# Patient Record
Sex: Female | Born: 1960 | Race: White | Hispanic: No | Marital: Married | Smoking: Never smoker
Health system: Southern US, Community
[De-identification: ages and names within clinical notes are randomized; demographics above are authoritative.]

## PROBLEM LIST (undated history)

## (undated) DIAGNOSIS — H9190 Unspecified hearing loss, unspecified ear: Secondary | ICD-10-CM

## (undated) DIAGNOSIS — K5790 Diverticulosis of intestine, part unspecified, without perforation or abscess without bleeding: Secondary | ICD-10-CM

## (undated) DIAGNOSIS — R112 Nausea with vomiting, unspecified: Secondary | ICD-10-CM

## (undated) DIAGNOSIS — A020 Salmonella enteritis: Secondary | ICD-10-CM

## (undated) DIAGNOSIS — N63 Unspecified lump in unspecified breast: Secondary | ICD-10-CM

## (undated) DIAGNOSIS — E119 Type 2 diabetes mellitus without complications: Secondary | ICD-10-CM

## (undated) DIAGNOSIS — T4145XA Adverse effect of unspecified anesthetic, initial encounter: Secondary | ICD-10-CM

## (undated) DIAGNOSIS — T8859XA Other complications of anesthesia, initial encounter: Secondary | ICD-10-CM

## (undated) DIAGNOSIS — E039 Hypothyroidism, unspecified: Secondary | ICD-10-CM

## (undated) DIAGNOSIS — M1990 Unspecified osteoarthritis, unspecified site: Secondary | ICD-10-CM

## (undated) DIAGNOSIS — B373 Candidiasis of vulva and vagina: Secondary | ICD-10-CM

## (undated) DIAGNOSIS — Z9889 Other specified postprocedural states: Secondary | ICD-10-CM

## (undated) DIAGNOSIS — T7840XA Allergy, unspecified, initial encounter: Secondary | ICD-10-CM

## (undated) DIAGNOSIS — C801 Malignant (primary) neoplasm, unspecified: Secondary | ICD-10-CM

## (undated) HISTORY — DX: Allergy, unspecified, initial encounter: T78.40XA

## (undated) HISTORY — DX: Candidiasis of vulva and vagina: B37.3

## (undated) HISTORY — DX: Unspecified osteoarthritis, unspecified site: M19.90

## (undated) HISTORY — DX: Salmonella enteritis: A02.0

## (undated) HISTORY — DX: Diverticulosis of intestine, part unspecified, without perforation or abscess without bleeding: K57.90

## (undated) HISTORY — DX: Malignant (primary) neoplasm, unspecified: C80.1

## (undated) HISTORY — PX: HYSTEROTOMY: SHX1776

---
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
## 1980-03-21 HISTORY — PX: LAPAROTOMY: SHX154

## 1995-03-22 HISTORY — PX: MENISCUS REPAIR: SHX5179

## 1997-06-26 ENCOUNTER — Ambulatory Visit (HOSPITAL_COMMUNITY): Admission: RE | Admit: 1997-06-26 | Discharge: 1997-06-26 | Payer: Self-pay | Admitting: Obstetrics and Gynecology

## 1997-07-01 ENCOUNTER — Ambulatory Visit (HOSPITAL_COMMUNITY): Admission: RE | Admit: 1997-07-01 | Discharge: 1997-07-01 | Payer: Self-pay | Admitting: Obstetrics and Gynecology

## 1998-01-01 ENCOUNTER — Ambulatory Visit (HOSPITAL_COMMUNITY): Admission: RE | Admit: 1998-01-01 | Discharge: 1998-01-01 | Payer: Self-pay | Admitting: Obstetrics and Gynecology

## 1998-11-09 ENCOUNTER — Other Ambulatory Visit: Admission: RE | Admit: 1998-11-09 | Discharge: 1998-11-09 | Payer: Self-pay | Admitting: Obstetrics and Gynecology

## 1998-11-12 ENCOUNTER — Encounter: Payer: Self-pay | Admitting: Obstetrics and Gynecology

## 1998-11-12 ENCOUNTER — Ambulatory Visit (HOSPITAL_COMMUNITY): Admission: RE | Admit: 1998-11-12 | Discharge: 1998-11-12 | Payer: Self-pay | Admitting: Obstetrics and Gynecology

## 1999-03-02 ENCOUNTER — Other Ambulatory Visit: Admission: RE | Admit: 1999-03-02 | Discharge: 1999-03-02 | Payer: Self-pay | Admitting: Obstetrics and Gynecology

## 1999-06-01 ENCOUNTER — Other Ambulatory Visit: Admission: RE | Admit: 1999-06-01 | Discharge: 1999-06-01 | Payer: Self-pay | Admitting: Obstetrics and Gynecology

## 1999-11-23 ENCOUNTER — Other Ambulatory Visit: Admission: RE | Admit: 1999-11-23 | Discharge: 1999-11-23 | Payer: Self-pay | Admitting: Obstetrics and Gynecology

## 2000-01-26 ENCOUNTER — Ambulatory Visit (HOSPITAL_COMMUNITY): Admission: RE | Admit: 2000-01-26 | Discharge: 2000-01-26 | Payer: Self-pay | Admitting: Obstetrics and Gynecology

## 2000-01-26 ENCOUNTER — Encounter: Payer: Self-pay | Admitting: Obstetrics and Gynecology

## 2000-04-13 ENCOUNTER — Other Ambulatory Visit: Admission: RE | Admit: 2000-04-13 | Discharge: 2000-04-13 | Payer: Self-pay | Admitting: Obstetrics and Gynecology

## 2000-05-29 ENCOUNTER — Other Ambulatory Visit: Admission: RE | Admit: 2000-05-29 | Discharge: 2000-05-29 | Payer: Self-pay | Admitting: Obstetrics and Gynecology

## 2000-05-30 ENCOUNTER — Encounter (INDEPENDENT_AMBULATORY_CARE_PROVIDER_SITE_OTHER): Payer: Self-pay | Admitting: Specialist

## 2000-05-30 ENCOUNTER — Other Ambulatory Visit: Admission: RE | Admit: 2000-05-30 | Discharge: 2000-05-30 | Payer: Self-pay | Admitting: Obstetrics and Gynecology

## 2001-01-29 ENCOUNTER — Encounter: Payer: Self-pay | Admitting: Obstetrics and Gynecology

## 2001-01-29 ENCOUNTER — Ambulatory Visit (HOSPITAL_COMMUNITY): Admission: RE | Admit: 2001-01-29 | Discharge: 2001-01-29 | Payer: Self-pay | Admitting: Obstetrics and Gynecology

## 2001-05-18 ENCOUNTER — Other Ambulatory Visit: Admission: RE | Admit: 2001-05-18 | Discharge: 2001-05-18 | Payer: Self-pay | Admitting: Gynecology

## 2002-01-30 ENCOUNTER — Ambulatory Visit (HOSPITAL_COMMUNITY): Admission: RE | Admit: 2002-01-30 | Discharge: 2002-01-30 | Payer: Self-pay | Admitting: Gynecology

## 2002-01-30 ENCOUNTER — Encounter: Payer: Self-pay | Admitting: Gynecology

## 2002-03-21 HISTORY — PX: AUGMENTATION MAMMAPLASTY: SUR837

## 2002-05-29 ENCOUNTER — Other Ambulatory Visit: Admission: RE | Admit: 2002-05-29 | Discharge: 2002-05-29 | Payer: Self-pay | Admitting: Gynecology

## 2003-04-07 ENCOUNTER — Encounter: Admission: RE | Admit: 2003-04-07 | Discharge: 2003-04-07 | Payer: Self-pay | Admitting: Gynecology

## 2003-06-10 ENCOUNTER — Other Ambulatory Visit: Admission: RE | Admit: 2003-06-10 | Discharge: 2003-06-10 | Payer: Self-pay | Admitting: Gynecology

## 2004-05-13 ENCOUNTER — Encounter: Admission: RE | Admit: 2004-05-13 | Discharge: 2004-05-13 | Payer: Self-pay | Admitting: Gynecology

## 2004-06-07 ENCOUNTER — Other Ambulatory Visit: Admission: RE | Admit: 2004-06-07 | Discharge: 2004-06-07 | Payer: Self-pay | Admitting: Gynecology

## 2005-03-24 ENCOUNTER — Encounter: Admission: RE | Admit: 2005-03-24 | Discharge: 2005-03-24 | Payer: Self-pay | Admitting: Gynecology

## 2005-05-16 ENCOUNTER — Encounter: Admission: RE | Admit: 2005-05-16 | Discharge: 2005-05-16 | Payer: Self-pay | Admitting: Gynecology

## 2005-06-03 ENCOUNTER — Encounter: Admission: RE | Admit: 2005-06-03 | Discharge: 2005-06-03 | Payer: Self-pay | Admitting: Gynecology

## 2005-06-08 ENCOUNTER — Other Ambulatory Visit: Admission: RE | Admit: 2005-06-08 | Discharge: 2005-06-08 | Payer: Self-pay | Admitting: Gynecology

## 2006-06-27 ENCOUNTER — Other Ambulatory Visit: Admission: RE | Admit: 2006-06-27 | Discharge: 2006-06-27 | Payer: Self-pay | Admitting: Gynecology

## 2006-07-18 ENCOUNTER — Encounter: Admission: RE | Admit: 2006-07-18 | Discharge: 2006-07-18 | Payer: Self-pay | Admitting: Gynecology

## 2006-07-20 ENCOUNTER — Encounter: Admission: RE | Admit: 2006-07-20 | Discharge: 2006-07-20 | Payer: Self-pay | Admitting: Gynecology

## 2007-07-19 ENCOUNTER — Encounter: Admission: RE | Admit: 2007-07-19 | Discharge: 2007-07-19 | Payer: Self-pay | Admitting: Gynecology

## 2008-03-25 ENCOUNTER — Other Ambulatory Visit: Admission: RE | Admit: 2008-03-25 | Discharge: 2008-03-25 | Payer: Self-pay | Admitting: Gynecology

## 2008-07-28 ENCOUNTER — Encounter: Admission: RE | Admit: 2008-07-28 | Discharge: 2008-07-28 | Payer: Self-pay | Admitting: Gynecology

## 2009-01-14 ENCOUNTER — Encounter: Admission: RE | Admit: 2009-01-14 | Discharge: 2009-03-05 | Payer: Self-pay | Admitting: Orthopedic Surgery

## 2009-08-04 ENCOUNTER — Encounter: Admission: RE | Admit: 2009-08-04 | Discharge: 2009-08-04 | Payer: Self-pay | Admitting: Gynecology

## 2009-08-06 ENCOUNTER — Encounter: Admission: RE | Admit: 2009-08-06 | Discharge: 2009-08-06 | Payer: Self-pay | Admitting: Gynecology

## 2010-07-08 ENCOUNTER — Other Ambulatory Visit: Payer: Self-pay | Admitting: Gynecology

## 2010-07-08 DIAGNOSIS — Z1231 Encounter for screening mammogram for malignant neoplasm of breast: Secondary | ICD-10-CM

## 2010-08-06 ENCOUNTER — Ambulatory Visit
Admission: RE | Admit: 2010-08-06 | Discharge: 2010-08-06 | Disposition: A | Discharge status: Home / Self Care | Payer: Commercial Managed Care - PPO | Source: Ambulatory Visit | Attending: Gynecology | Admitting: Gynecology

## 2010-08-06 DIAGNOSIS — Z1231 Encounter for screening mammogram for malignant neoplasm of breast: Secondary | ICD-10-CM

## 2012-03-21 HISTORY — PX: COLONOSCOPY: SHX174

## 2012-08-17 ENCOUNTER — Other Ambulatory Visit: Payer: Self-pay

## 2012-08-17 DIAGNOSIS — Z1231 Encounter for screening mammogram for malignant neoplasm of breast: Secondary | ICD-10-CM

## 2012-09-17 DIAGNOSIS — E119 Type 2 diabetes mellitus without complications: Secondary | ICD-10-CM | POA: Insufficient documentation

## 2012-09-19 ENCOUNTER — Ambulatory Visit
Admission: RE | Admit: 2012-09-19 | Discharge: 2012-09-19 | Disposition: A | Discharge status: Home / Self Care | Payer: Commercial Managed Care - PPO | Source: Ambulatory Visit

## 2012-09-19 DIAGNOSIS — Z1231 Encounter for screening mammogram for malignant neoplasm of breast: Secondary | ICD-10-CM

## 2013-08-01 ENCOUNTER — Other Ambulatory Visit: Payer: Self-pay

## 2013-08-01 DIAGNOSIS — Z1231 Encounter for screening mammogram for malignant neoplasm of breast: Secondary | ICD-10-CM

## 2013-08-01 DIAGNOSIS — Z803 Family history of malignant neoplasm of breast: Secondary | ICD-10-CM

## 2013-09-30 ENCOUNTER — Ambulatory Visit
Admission: RE | Admit: 2013-09-30 | Discharge: 2013-09-30 | Disposition: A | Discharge status: Home / Self Care | Payer: PRIVATE HEALTH INSURANCE | Source: Ambulatory Visit

## 2013-09-30 ENCOUNTER — Encounter (INDEPENDENT_AMBULATORY_CARE_PROVIDER_SITE_OTHER): Payer: Self-pay

## 2013-09-30 DIAGNOSIS — Z1231 Encounter for screening mammogram for malignant neoplasm of breast: Secondary | ICD-10-CM

## 2013-09-30 DIAGNOSIS — Z803 Family history of malignant neoplasm of breast: Secondary | ICD-10-CM

## 2014-01-27 ENCOUNTER — Emergency Department (INDEPENDENT_AMBULATORY_CARE_PROVIDER_SITE_OTHER)
Admission: EM | Admit: 2014-01-27 | Discharge: 2014-01-27 | Disposition: A | Discharge status: Home / Self Care | Payer: PRIVATE HEALTH INSURANCE | Source: Home / Self Care | Attending: Family Medicine | Admitting: Family Medicine

## 2014-01-27 ENCOUNTER — Encounter (HOSPITAL_COMMUNITY): Payer: Self-pay | Admitting: Emergency Medicine

## 2014-01-27 DIAGNOSIS — R0982 Postnasal drip: Secondary | ICD-10-CM

## 2014-01-27 DIAGNOSIS — H109 Unspecified conjunctivitis: Secondary | ICD-10-CM

## 2014-01-27 DIAGNOSIS — J069 Acute upper respiratory infection, unspecified: Secondary | ICD-10-CM

## 2014-01-27 HISTORY — DX: Type 2 diabetes mellitus without complications: E11.9

## 2014-01-27 LAB — POCT RAPID STREP A: Streptococcus, Group A Screen (Direct): NEGATIVE

## 2014-01-27 MED ORDER — POLYMYXIN B-TRIMETHOPRIM 10000-0.1 UNIT/ML-% OP SOLN: 1.0000 [drp] | OPHTHALMIC | Status: DC

## 2014-01-27 NOTE — ED Notes (Signed)
Pt states she started with a cough and sore throat 8 days ago.  It has developed into laryngitis and now eye drainage.  She saw [facility] at [facility] on Friday and was told it was viral, but she is traveling on Wednesday and wanted to make sure she did not have anything else.

## 2014-01-27 NOTE — Discharge Instructions (Signed)
Conjunctivitis Zaditor eye drops for eye inflammation Conjunctivitis is commonly called "pink eye." Conjunctivitis can be caused by bacterial or viral infection, allergies, or injuries. There is usually redness of the lining of the eye, itching, discomfort, and sometimes discharge. There may be deposits of matter along the eyelids. A viral infection usually causes a watery discharge, while a bacterial infection causes a yellowish, thick discharge. Pink eye is very contagious and spreads by direct contact. You may be given antibiotic eyedrops as part of your treatment. Before using your eye medicine, remove all drainage from the eye by washing gently with warm water and cotton balls. Continue to use the medication until you have awakened 2 mornings in a row without discharge from the eye. Do not rub your eye. This increases the irritation and helps spread infection. Use separate towels from other household members. Wash your hands with soap and water before and after touching your eyes. Use cold compresses to reduce pain and sunglasses to relieve irritation from light. Do not wear contact lenses or wear eye makeup until the infection is gone. SEEK MEDICAL CARE IF:   Your symptoms are not better after 3 days of treatment.  You have increased pain or trouble seeing.  The outer eyelids become very red or swollen. Document Released: 04/14/2004 Document Revised: 05/30/2011 Document Reviewed: 03/07/2005 [facility] Patient Information 2015 [place], [place]. This information is not intended to replace advice given to you by your health care provider. Make sure you discuss any questions you have with your health care provider.  Upper Respiratory Infection, Adult An upper respiratory infection (URI) is also sometimes known as the common cold. The upper respiratory tract includes the nose, sinuses, throat, trachea, and bronchi. Bronchi are the airways leading to the lungs. Most people improve within 1 week, but  symptoms can last up to 2 weeks. A residual cough may last even longer.  CAUSES Many different viruses can infect the tissues lining the upper respiratory tract. The tissues become irritated and inflamed and often become very moist. Mucus production is also common. A cold is contagious. You can easily spread the virus to others by oral contact. This includes kissing, sharing a glass, coughing, or sneezing. Touching your mouth or nose and then touching a surface, which is then touched by another person, can also spread the virus. SYMPTOMS  Symptoms typically develop 1 to 3 days after you come in contact with a cold virus. Symptoms vary from person to person. They may include:  Runny nose.  Sneezing.  Nasal congestion.  Sinus irritation.  Sore throat.  Loss of voice (laryngitis).  Cough.  Fatigue.  Muscle aches.  Loss of appetite.  Headache.  Low-grade fever. DIAGNOSIS  You might diagnose your own cold based on familiar symptoms, since most people get a cold 2 to 3 times a year. Your caregiver can confirm this based on your exam. Most importantly, your caregiver can check that your symptoms are not due to another disease such as strep throat, sinusitis, pneumonia, asthma, or epiglottitis. Blood tests, throat tests, and X-rays are not necessary to diagnose a common cold, but they may sometimes be helpful in excluding other more serious diseases. Your caregiver will decide if any further tests are required. RISKS AND COMPLICATIONS  You may be at risk for a more severe case of the common cold if you smoke cigarettes, have chronic heart disease (such as heart failure) or lung disease (such as asthma), or if you have a weakened immune system. The very  young and very old are also at risk for more serious infections. Bacterial sinusitis, middle ear infections, and bacterial pneumonia can complicate the common cold. The common cold can worsen asthma and chronic obstructive pulmonary disease  (COPD). Sometimes, these complications can require emergency medical care and may be life-threatening. PREVENTION  The best way to protect against getting a cold is to practice good hygiene. Avoid oral or hand contact with people with cold symptoms. Wash your hands often if contact occurs. There is no clear evidence that vitamin C, vitamin E, echinacea, or exercise reduces the chance of developing a cold. However, it is always recommended to get plenty of rest and practice good nutrition. TREATMENT  Treatment is directed at relieving symptoms. There is no cure. Antibiotics are not effective, because the infection is caused by a virus, not by bacteria. Treatment may include:  Increased fluid intake. Sports drinks offer valuable electrolytes, sugars, and fluids.  Breathing heated mist or steam (vaporizer or shower).  Eating chicken soup or other clear broths, and maintaining good nutrition.  Getting plenty of rest.  Using gargles or lozenges for comfort.  Controlling fevers with ibuprofen or acetaminophen as directed by your caregiver.  Increasing usage of your inhaler if you have asthma. Zinc gel and zinc lozenges, taken in the first 24 hours of the common cold, can shorten the duration and lessen the severity of symptoms. Pain medicines may help with fever, muscle aches, and throat pain. A variety of non-prescription medicines are available to treat congestion and runny nose. Your caregiver can make recommendations and may suggest nasal or lung inhalers for other symptoms.  HOME CARE INSTRUCTIONS   Only take over-the-counter or prescription medicines for pain, discomfort, or fever as directed by your caregiver.  Use a warm mist humidifier or inhale steam from a shower to increase air moisture. This may keep secretions moist and make it easier to breathe.  Drink enough water and fluids to keep your urine clear or pale yellow.  Rest as needed.  Return to work when your temperature has  returned to normal or as your caregiver advises. You may need to stay home longer to avoid infecting others. You can also use a face mask and careful hand washing to prevent spread of the virus. SEEK MEDICAL CARE IF:   After the first few days, you feel you are getting worse rather than better.  You need your caregiver's advice about medicines to control symptoms.  You develop chills, worsening shortness of breath, or brown or red sputum. These may be signs of pneumonia.  You develop yellow or brown nasal discharge or pain in the face, especially when you bend forward. These may be signs of sinusitis.  You develop a fever, swollen neck glands, pain with swallowing, or white areas in the back of your throat. These may be signs of strep throat. SEEK IMMEDIATE MEDICAL CARE IF:   You have a fever.  You develop severe or persistent headache, ear pain, sinus pain, or chest pain.  You develop wheezing, a prolonged cough, cough up blood, or have a change in your usual mucus (if you have chronic lung disease).  You develop sore muscles or a stiff neck. Document Released: 08/31/2000 Document Revised: 05/30/2011 Document Reviewed: 06/12/2013 [facility] Patient Information 2015 [place], [place]. This information is not intended to replace advice given to you by your health care provider. Make sure you discuss any questions you have with your health care provider.

## 2014-01-27 NOTE — ED Provider Notes (Signed)
CSN: [id number]     Arrival date & time 01/27/14  0813 History   First MD Initiated Contact with Patient 01/27/14 [phone]     Chief Complaint  Patient presents with  . Eye Drainage    bilateral  . Sore Throat  . Laryngitis  . Cough   (Consider location/radiation/quality/duration/timing/severity/associated sxs/prior Treatment) HPI Comments: 53 year old female presents with URI-type symptoms.  Initially sore throat followed by laryngitis approximately 8 days ago. She  had a low-grade fever stated as possibly 99. Continues to have mild PND, throat feels a little bit better but more recently having red eyes with straining.  She was seen by. [facility] with similar symptoms 3 days ago and was told she had a virus.   Past Medical History  Diagnosis Date  . Diabetes mellitus without complication    Past Surgical History  Procedure Laterality Date  . Laparotomy     Family History  Problem Relation Age of Onset  . Arthritis Mother   . Hypertension Father   . Diabetes Father   . Cancer Sister    History  Substance Use Topics  . Smoking status: Never Smoker   . Smokeless tobacco: Never Used  . Alcohol Use: No   OB History    No data available     Review of Systems  Constitutional: Positive for fever and activity change. Negative for chills, appetite change and fatigue.  HENT: Positive for congestion, postnasal drip, rhinorrhea, sore throat and voice change. Negative for facial swelling.   Eyes: Negative.   Respiratory: Negative.   Cardiovascular: Negative.   Musculoskeletal: Negative for neck pain and neck stiffness.  Skin: Negative for pallor and rash.  Neurological: Negative.     Allergies  Bee venom; Codeine; and Penicillins  Home Medications   Prior to Admission medications   Medication Sig Start Date End Date Taking? Authorizing Provider  estradiol (VIVELLE-DOT) 0.0375 MG/24HR Place 1 patch onto the skin 2 (two) times a week.   Yes Historical Provider, MD   insulin NPH Human (HUMULIN N,NOVOLIN N) 100 UNIT/ML injection Inject 6 Units into the skin at bedtime.   Yes Historical Provider, MD  metFORMIN (GLUCOPHAGE) 500 MG tablet Take 200 mg by mouth daily before supper.   Yes Historical Provider, MD  progesterone (PROMETRIUM) 100 MG capsule Take 100 mg by mouth daily.   Yes Historical Provider, MD  trimethoprim-polymyxin b (POLYTRIM) ophthalmic solution Place 1 drop into both eyes every 4 (four) hours. 01/27/14   Janne Napoleon, NP   BP 132/80 mmHg  Pulse 75  Temp(Src) 98.3 F (36.8 C) (Oral)  Resp 16  SpO2 99%  LMP 08/03/2010 Physical Exam  Constitutional: She is oriented to person, place, and time. She appears well-developed and well-nourished. No distress.  HENT:  bialt TMs' nl OP with minor erythema, cobblestoning and scant clear PND  Eyes: EOM are normal.  Conjunctivae erythematous, sclera with minor redness. No drainage.  Neck: Normal range of motion. Neck supple.  Cardiovascular: Normal rate, regular rhythm and normal heart sounds.   Pulmonary/Chest: Effort normal and breath sounds normal. No respiratory distress. She has no wheezes. She has no rales.  Abdominal: Soft. There is no tenderness.  Musculoskeletal: Normal range of motion. She exhibits no edema.  Lymphadenopathy:    She has no cervical adenopathy.  Neurological: She is alert and oriented to person, place, and time.  Skin: Skin is warm and dry. No rash noted.  Psychiatric: She has a normal mood and affect.  Nursing note  and vitals reviewed.   ED Course  Procedures (including critical care time) Labs Review Labs Reviewed  POCT RAPID STREP A ([place])   Results for orders placed or performed during the hospital encounter of 01/27/14  POCT rapid strep A [facility])  Result Value Ref Range   Streptococcus, Group A Screen (Direct) NEGATIVE NEGATIVE    Imaging Review No results found.   MDM   1. URI (upper respiratory infection)   2. PND (post-nasal  drip)   3. Bilateral conjunctivitis   Polytrim opth zaditor optha Continue your OTC meds for symptoms Rest , fluids     Janne Napoleon, NP 01/27/14 [phone]

## 2014-01-29 LAB — CULTURE, GROUP A STREP

## 2014-06-23 ENCOUNTER — Encounter: Payer: Self-pay | Admitting: *Deleted

## 2014-08-25 ENCOUNTER — Other Ambulatory Visit: Payer: Self-pay

## 2014-08-25 DIAGNOSIS — Z1231 Encounter for screening mammogram for malignant neoplasm of breast: Secondary | ICD-10-CM

## 2014-10-06 ENCOUNTER — Ambulatory Visit
Admission: RE | Admit: 2014-10-06 | Discharge: 2014-10-06 | Disposition: A | Discharge status: Home / Self Care | Payer: BLUE CROSS/BLUE SHIELD | Source: Ambulatory Visit

## 2014-10-06 DIAGNOSIS — Z1231 Encounter for screening mammogram for malignant neoplasm of breast: Secondary | ICD-10-CM

## 2015-06-20 DIAGNOSIS — A020 Salmonella enteritis: Secondary | ICD-10-CM

## 2015-06-20 HISTORY — DX: Salmonella enteritis: A02.0

## 2015-07-20 DIAGNOSIS — B3731 Acute candidiasis of vulva and vagina: Secondary | ICD-10-CM

## 2015-07-20 DIAGNOSIS — B373 Candidiasis of vulva and vagina: Secondary | ICD-10-CM

## 2015-07-20 HISTORY — DX: Acute candidiasis of vulva and vagina: B37.31

## 2015-07-20 HISTORY — DX: Candidiasis of vulva and vagina: B37.3

## 2015-07-23 ENCOUNTER — Other Ambulatory Visit: Payer: BLUE CROSS/BLUE SHIELD

## 2015-07-23 ENCOUNTER — Ambulatory Visit (INDEPENDENT_AMBULATORY_CARE_PROVIDER_SITE_OTHER): Payer: BLUE CROSS/BLUE SHIELD | Admitting: Internal Medicine

## 2015-07-23 ENCOUNTER — Encounter: Payer: Self-pay | Admitting: Internal Medicine

## 2015-07-23 VITALS — BP 132/70 | HR 80 | Ht 62.21 in | Wt 122.5 lb

## 2015-07-23 DIAGNOSIS — R197 Diarrhea, unspecified: Secondary | ICD-10-CM

## 2015-07-23 DIAGNOSIS — A09 Infectious gastroenteritis and colitis, unspecified: Secondary | ICD-10-CM | POA: Diagnosis not present

## 2015-07-23 MED ORDER — TINIDAZOLE 500 MG PO TABS: 2.0000 g | ORAL_TABLET | Freq: Every day | ORAL | Status: DC

## 2015-07-23 NOTE — Progress Notes (Signed)
Subjective:    Patient ID: Elizabeth [name], female    DOB: 10-02-1960, 55 y.o.   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 OK upon return April 14but then began to have bloating, mild cramps and loose watery stools to soft. Has had up to several stools in 24 hrs and has recently had urge incontonence. Pepto-bismol and loperamide seemed to have helped. No bleeding. ? Fever x 1 All other GI ROS negative Negative screening colonoscopy 2014 [place]  Allergies  Allergen Reactions  . Bee Venom   . Codeine   . Penicillins    Outpatient Prescriptions Prior to Visit  Medication Sig Dispense Refill  . insulin NPH Human (HUMULIN N,NOVOLIN N) 100 UNIT/ML injection Inject 13 Units into the skin at bedtime.     . metFORMIN (GLUCOPHAGE) 500 MG tablet Take 1,000 mg by mouth 2 (two) times daily.     . progesterone (PROMETRIUM) 100 MG capsule Take 150 mg by mouth daily.     Marland Kitchen estradiol (VIVELLE-DOT) 0.0375 MG/24HR Place 1 patch onto the skin 2 (two) times a week.    . trimethoprim-polymyxin b (POLYTRIM) ophthalmic solution Place 1 drop into both eyes every 4 (four) hours. 10 mL 0   No facility-administered medications prior to visit.   Past Medical History  Diagnosis Date  . Diabetes mellitus without complication ([place])   . Diverticulosis    Past Surgical History  Procedure Laterality Date  . Laparotomy  1982  . Meniscus repair Left 1997  . Tonsillectomy  1966   Social History   Social History  . Marital Status: Married    Spouse Name: N/A  . Number of Children: 3  . Years of Education: N/A   Occupational History  . RN    Social History Main Topics  . Smoking status: Never Smoker   . Smokeless tobacco: Never Used  . Alcohol Use: 0.0 oz/week    0 Standard drinks or equivalent per week     Comment: couple glasses of wine a week  . Drug Use: No  . Sexual Activity:  Not Asked   Other Topics Concern  . 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 Arthritis Mother   . Hypertension Father   . Diabetes Father   . Breast cancer Sister   . Heart disease Father     Review of Systems As above all other ROS negative    Objective:   Physical Exam @BP  132/70 mmHg  Pulse 80  Ht 5' 2.21" (1.58 m)  Wt 122 lb 8 oz (55.566 kg)  BMI 22.26 kg/m2  LMP 08/03/2010@  General:  Well-developed, well-nourished and in no acute distress Eyes:  anicteric. Lungs: Clear to auscultation bilaterally. Heart:  S1S2, no rubs, murmurs, gallops. Abdomen:  soft, non-tender, no hepatosplenomegaly, hernia, or mass and BS+.  Extremities:   no edema, cyanosis or clubbing  Psych:  appropriate mood and  Affect.    Assessment & Plan:   Encounter Diagnosis  Name Primary?  . Diarrhea of presumed infectious origin Yes   Giardia, amebiasis, high on list. Will do stool Giardia/Cryptosporidum and GI stool pathogen panel Tx w/Tindazole 2000 mg qd x 3d Prn loperamide  Off work in 2 d - potentially infectious - need diarrhea to resolve  F/u will be arranged as needed  [id number] E, MD

## 2015-07-23 NOTE — Patient Instructions (Addendum)
   Your physician has requested that you go to the basement for the following lab work before leaving today: Stool studies  We have sent the following medications to your pharmacy for you to pick up at your convenience: Tindamax  Stay out of work until diarrhea gone, note given.   I appreciate the opportunity to care for you.

## 2015-07-24 ENCOUNTER — Telehealth: Payer: Self-pay | Admitting: Gastroenterology

## 2015-07-24 LAB — GASTROINTESTINAL PATHOGEN PANEL PCR
C. difficile Tox A/B, PCR: NOT DETECTED
CRYPTOSPORIDIUM, PCR: NOT DETECTED
Campylobacter, PCR: NOT DETECTED
E COLI (STEC) STX1/STX2, PCR: NOT DETECTED
E coli (ETEC) LT/ST PCR: NOT DETECTED
E coli 0157, PCR: NOT DETECTED
GIARDIA LAMBLIA, PCR: NOT DETECTED
NOROVIRUS, PCR: NOT DETECTED
ROTAVIRUS, PCR: NOT DETECTED
SHIGELLA, PCR: NOT DETECTED
Salmonella, PCR: DETECTED — CR

## 2015-07-24 MED ORDER — CIPROFLOXACIN HCL 500 MG PO TABS: 500.0000 mg | ORAL_TABLET | Freq: Two times a day (BID) | ORAL | Status: DC

## 2015-07-25 NOTE — Telephone Encounter (Signed)
Informed results of stool pathogen positive for Salmonella, given she is symptomatic, sent Rx for Cipro 500mg  BID x 10 days

## 2015-07-26 NOTE — Progress Notes (Signed)
Quick Note:  Started on cipro 5/6 ______

## 2015-07-31 LAB — GIARDIA/CRYPTOSPORIDIUM (EIA): Source:: NEGATIVE

## 2015-08-05 ENCOUNTER — Encounter: Payer: Self-pay | Admitting: Internal Medicine

## 2015-08-05 ENCOUNTER — Telehealth: Payer: Self-pay | Admitting: Internal Medicine

## 2015-08-05 MED ORDER — FLUCONAZOLE 150 MG PO TABS: 150.0000 mg | ORAL_TABLET | Freq: Once | ORAL | Status: DC

## 2015-08-05 NOTE — Telephone Encounter (Signed)
Vaginal itching dc consistent with yeast infection after Abx  Will Rx fluconazole 150 mg qd  Having 2 loose stools/day still but overall better - advised will take some time to return to NL - is using probiotic  F/u prn

## 2015-09-23 ENCOUNTER — Other Ambulatory Visit: Payer: Self-pay

## 2015-09-23 ENCOUNTER — Other Ambulatory Visit: Payer: Self-pay | Admitting: Family Medicine

## 2015-09-23 ENCOUNTER — Encounter: Payer: Self-pay | Admitting: Family Medicine

## 2015-09-23 ENCOUNTER — Ambulatory Visit (INDEPENDENT_AMBULATORY_CARE_PROVIDER_SITE_OTHER): Payer: BLUE CROSS/BLUE SHIELD | Admitting: Family Medicine

## 2015-09-23 VITALS — BP 118/70 | HR 75 | Ht 62.21 in | Wt 126.0 lb

## 2015-09-23 DIAGNOSIS — S76301A Unspecified injury of muscle, fascia and tendon of the posterior muscle group at thigh level, right thigh, initial encounter: Secondary | ICD-10-CM

## 2015-09-23 DIAGNOSIS — S76311A Strain of muscle, fascia and tendon of the posterior muscle group at thigh level, right thigh, initial encounter: Secondary | ICD-10-CM | POA: Diagnosis not present

## 2015-09-23 DIAGNOSIS — Z1231 Encounter for screening mammogram for malignant neoplasm of breast: Secondary | ICD-10-CM

## 2015-09-23 DIAGNOSIS — S76319A Strain of muscle, fascia and tendon of the posterior muscle group at thigh level, unspecified thigh, initial encounter: Secondary | ICD-10-CM | POA: Insufficient documentation

## 2015-09-23 DIAGNOSIS — S76302A Unspecified injury of muscle, fascia and tendon of the posterior muscle group at thigh level, left thigh, initial encounter: Secondary | ICD-10-CM

## 2015-09-23 MED ORDER — VITAMIN D (ERGOCALCIFEROL) 1.25 MG (50000 UNIT) PO CAPS: 50000.0000 [IU] | ORAL_CAPSULE | ORAL | Status: DC

## 2015-09-23 MED ORDER — MELOXICAM 15 MG PO TABS: 15.0000 mg | ORAL_TABLET | Freq: Every day | ORAL | Status: DC

## 2015-09-23 MED ORDER — NITROGLYCERIN 0.2 MG/HR TD PT24: MEDICATED_PATCH | TRANSDERMAL | Status: DC

## 2015-09-23 NOTE — Progress Notes (Signed)
Pre visit review using our clinic review tool, if applicable. No additional management support is needed unless otherwise documented below in the visit note. 

## 2015-09-23 NOTE — Progress Notes (Signed)
[name] [address], [place] [address] Phone: [phone] Subjective:     CC: right leg pain  [id number] Elizabeth [name] is a 55 y.o. female coming in with complaint of right leg pain. Patient states that she has had discomfort quite some time but recently got significant worse. Patient was trying to chase down a 37-year-old and felt a sharp pain on her right buttocks. This is usually where she has pain. Has noticed radiation going down the leg. Patient states he can even go to her heel. Patient states that it is severe enough that is stopping her from certain activities. Patient states that she has not been able to go to run or jog for probably a decade. Patient was told that she likely had a partial tear of her hamstring but never went to formal physical therapy or did anything else about it. Patient states that at the moment even with walking she is having pain. Denies any associated back pain. Denies any significant weakness. Has noticed it feels better when she walks down the stairs backwards. Has not tried any home modalities at this time.     Past Medical History  Diagnosis Date  . Diabetes mellitus without complication ([place])   . Diverticulosis   . 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 Vaginal candidiasis 07/2015    after antibiotics   Past Surgical History  Procedure Laterality Date  . Laparotomy  1982  . Meniscus repair Left 1997  . Tonsillectomy  1966  . Colonoscopy  2014   Social History   Social History  . Marital Status: Married    Spouse Name: N/A  . Number of Children: 3  . Years of Education: N/A   Occupational History  . RN    Social History Main Topics  . Smoking status: Never Smoker   . Smokeless tobacco: Never Used  . Alcohol Use: 0.0 oz/week    0 Standard drinks or equivalent per week     Comment: couple glasses of wine a week  . Drug Use: No  . Sexual Activity: Not Asked   Other Topics Concern  . 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 Bee Venom   . Codeine   . Penicillins    Family History  Problem Relation Age of Onset  . Arthritis Mother   . Hypertension Father   . Diabetes Father   . Breast cancer Sister   . Heart disease Father     Past medical history, social, surgical and family history all reviewed in electronic medical record.  No pertanent information unless stated regarding to the chief complaint.   Review of Systems: No headache, visual changes, nausea, vomiting, diarrhea, constipation, dizziness, abdominal pain, skin rash, fevers, chills, night sweats, weight loss, swollen lymph nodes, body aches, joint swelling, muscle aches, chest pain, shortness of breath, mood changes.   Objective Blood pressure 118/70, pulse 75, height 5' 2.21" (1.58 m), weight 126 lb (57.153 kg), last menstrual period 08/03/2010, SpO2 98 %.  General: No apparent distress alert and oriented x3 mood and affect normal, dressed appropriately.  HEENT: Pupils equal, extraocular movements intact  Respiratory: Patient's speak in full sentences and does not appear short of breath  Cardiovascular: No lower extremity edema, non tender, no erythema  Skin: Warm dry intact with  no signs of infection or rash on extremities or on axial skeleton.  Abdomen: Soft nontender  Neuro: Cranial nerves II through XII are intact, neurovascularly intact in all extremities with 2+ DTRs and 2+ pulses.  Lymph: No lymphadenopathy of posterior or anterior cervical chain or axillae bilaterally.  Gait normal with good balance and coordination.  MSK:  Non tender with full range of motion and good stability and symmetric strength and tone of shoulders, elbows, wrist, hip, and ankles bilaterally.   right leg exam shows the patient does have severe tenderness to palpation in the ischial tuberosity. No defect noted in the  muscle. Patient does have tenderness to compression of the hamstring itself mid belly. Once again no defect. No significant weakness but patient does have pain with internal rotation and flexion of the leg. Neurovascularly intact distally with 5 out of 5 strength.  Limited musculoskeletal ultrasound was performed and interpreted by Hulan Saas, M  Limited ultrasound the patient's right hamstring shows that patient does have what appears to be a chronic avulsion fracture of the ischial area. No significant increase in Doppler flow or hypoechoic changes. Patient does have some very mild intersubstance tearing at the origin of the hamstring. Proximally 7 cm distally from this area seems to be an acute muscle injury less than 0.2 cm in diameter. Increasing Doppler flow and hypoechoic changes. Patient also has significant surrounding scar tissue formation. 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  Procedure note [id number]; 15 minutes spent for Therapeutic exercises as stated in above notes.  This included exercises focusing on stretching, strengthening, with significant focus on eccentric aspects.  Flexion and extension exercises focusing on eccentric to the hamstring as well as strengthening. Proper technique shown and discussed handout in great detail with ATC.  All questions were discussed and answered.      Impression and Recommendations:     This case required medical decision making of moderate complexity.      Note: This dictation was prepared with Dragon dictation along with smaller phrase technology. Any transcriptional errors that result from this process are unintentional.

## 2015-09-23 NOTE — Patient Instructions (Signed)
Good to see you.  Ice 20 minutes 2 times daily. Usually after activity and before bed. Meloxicam daily for 10 days Once weekly vitamin D for next 12 weeks.  Nitroglycerin Protocol   Apply 1/4 nitroglycerin patch to affected area daily.  Change position of patch within the affected area every 24 hours.  You may experience a headache during the first 1-2 weeks of using the patch, these should subside.  If you experience headaches after beginning nitroglycerin patch treatment, you may take your preferred over the counter pain reliever.  Another side effect of the nitroglycerin patch is skin irritation or rash related to patch adhesive.  Please notify our office if you develop more severe headaches or rash, and stop the patch.  Tendon healing with nitroglycerin patch may require 12 to 24 weeks depending on the extent of injury.  Men should not use if taking Viagra, Cialis, or Levitra.   Do not use if you have migraines or rosacea.  Thigh compression daily look at [facility] or [employer] sports.  Exercises 3 times a week. Start on Monday  OK to do pool or elliptical only  See me again in 3 weeks

## 2015-09-23 NOTE — Assessment & Plan Note (Signed)
Patient does have tear of the hamstring. We discussed icing regimen and home exercises. We discussed which activities to do an which was potentially avoid. Patient will try compression sleeve, patient work with athletic trainer to learn home exercises in greater detail. We discussed vitamin D supplementation secondary to the chronic avulsion. We discussed which activities to avoid. Oral anti-inflammatories are given as well. Due to the duration of this pain over the course of time patient is going to start a nitroglycerin and warned of potential side effect. Patient and will come back and see me again in 3-4 weeks for further evaluation and treatment.

## 2015-09-24 ENCOUNTER — Telehealth: Payer: Self-pay | Admitting: Family Medicine

## 2015-09-24 NOTE — Telephone Encounter (Signed)
Pt request to speak to the assistant concern about office visit yesterday. Please call her back

## 2015-09-24 NOTE — Telephone Encounter (Signed)
I would not.  I would try the conservative therapy for the next 3 weeks and see how we do.  I would expect some improvement.  We can discuss the possibility of scheduling at follow up but I am hoping the vitamin D will help

## 2015-09-24 NOTE — Telephone Encounter (Signed)
Spoke with patient and attempted to answer questions. Asked if Dr. Tamala Julian thought she should have the PRP injection sooner rather than wait 3weeks until she came back for her follow-up.

## 2015-09-25 NOTE — Telephone Encounter (Signed)
Responded to pt through mychart 

## 2015-09-28 ENCOUNTER — Other Ambulatory Visit: Payer: Self-pay | Admitting: Family Medicine

## 2015-09-28 ENCOUNTER — Encounter: Payer: Self-pay | Admitting: Family Medicine

## 2015-10-06 ENCOUNTER — Ambulatory Visit: Payer: BLUE CROSS/BLUE SHIELD | Admitting: Family Medicine

## 2015-10-09 ENCOUNTER — Ambulatory Visit
Admission: RE | Admit: 2015-10-09 | Discharge: 2015-10-09 | Disposition: A | Discharge status: Home / Self Care | Payer: BLUE CROSS/BLUE SHIELD | Source: Ambulatory Visit | Attending: Family Medicine | Admitting: Family Medicine

## 2015-10-09 DIAGNOSIS — Z1231 Encounter for screening mammogram for malignant neoplasm of breast: Secondary | ICD-10-CM

## 2015-10-12 ENCOUNTER — Ambulatory Visit (INDEPENDENT_AMBULATORY_CARE_PROVIDER_SITE_OTHER): Payer: Self-pay | Admitting: Family Medicine

## 2015-10-12 ENCOUNTER — Other Ambulatory Visit: Payer: Self-pay

## 2015-10-12 ENCOUNTER — Encounter: Payer: Self-pay | Admitting: Family Medicine

## 2015-10-12 VITALS — BP 132/80 | HR 86 | Wt 122.0 lb

## 2015-10-12 DIAGNOSIS — S76311D Strain of muscle, fascia and tendon of the posterior muscle group at thigh level, right thigh, subsequent encounter: Secondary | ICD-10-CM

## 2015-10-12 NOTE — Assessment & Plan Note (Signed)
Patient given injection today. We'll avoid any type of icing her home exercises for the next 48 hours. We discussed with compression, we will hold on the nitroglycerin for now. Follow-up again in 2 weeks. At that time we will start. Patient increase activity slowly.

## 2015-10-12 NOTE — Patient Instructions (Signed)
Good to see you  Injected the hamstring today  No exercising really for next 48 hours.  No ice for 48 hours either.  After that start stretches again 3 times a week.  After 1 week can start to increase to biking and consider elliptical but at very low resistance (50%) and slow pace.  Week to then increase 10% a week See me again in 2-3 weeks and we will get you back

## 2015-10-12 NOTE — Progress Notes (Signed)
[name] [address] [place], [place] [address] Phone: [phone] Subjective:     CC: right leg pain f/u  [id number]  Elizabeth [name] is a 55 y.o. female coming in with complaint of right leg pain. Patient states that she has had discomfort quite some time but recently got significant worse. Patient was trying to chase down a 35-year-old and felt a sharp pain on her right buttocks. Patient had more of a partial hamstring tear.  Patient was to start addressing patches, home exercises and compression. Patient states  Patient is elected do PRP injection.    Past Medical History:  Diagnosis Date  . Diabetes mellitus without complication ([place])   . Diverticulosis   . 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 Vaginal candidiasis 07/2015   after antibiotics   Past Surgical History:  Procedure Laterality Date  . COLONOSCOPY  2014  . LAPAROTOMY  1982  . MENISCUS REPAIR Left 1997  . TONSILLECTOMY  1966   Social History   Social History  . Marital status: Married    Spouse name: N/A  . Number of children: 3  . Years of education: N/A   Occupational History  . RN    Social History Main Topics  . Smoking status: Never Smoker  . Smokeless tobacco: Never Used  . Alcohol use 0.0 oz/week     Comment: couple glasses of wine a week  . Drug use: No  . Sexual activity: Not on file   Other Topics Concern  . 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 Bee Venom   . Codeine   . Penicillins    Family History  Problem Relation Age of Onset  . Arthritis Mother   . Hypertension Father   . Diabetes Father   . Breast cancer Sister   . Heart disease Father     Past medical history, social, surgical and family history all reviewed in electronic medical record.  No pertanent information unless stated regarding to the chief complaint.    Review of Systems: No headache, visual changes, nausea, vomiting, diarrhea, constipation, dizziness, abdominal pain, skin rash, fevers, chills, night sweats, weight loss, swollen lymph nodes, body aches, joint swelling, muscle aches, chest pain, shortness of breath, mood changes.   Objective  Last menstrual period 08/03/2010.  General: No apparent distress alert and oriented x3 mood and affect normal, dressed appropriately.  HEENT: Pupils equal, extraocular movements intact  Respiratory: Patient's speak in full sentences and does not appear short of breath  Cardiovascular: No lower extremity edema, non tender, no erythema  Skin: Warm dry intact with no signs of infection or rash on extremities or on axial skeleton.  Abdomen: Soft nontender  Neuro: Cranial nerves II through XII are intact, neurovascularly intact in all extremities with 2+ DTRs and 2+ pulses.  Lymph: No lymphadenopathy of posterior or anterior cervical chain or axillae bilaterally.  Gait normal with good balance and coordination.  MSK:  Non tender with full range of motion and good stability and symmetric strength and tone of shoulders, elbows, wrist, hip, and ankles bilaterally.   right leg exam shows the patient does have severe tenderness to palpation in the ischial tuberosity. No defect noted in the muscle. Patient does have tenderness to compression of the hamstring itself mid belly. Once again  no defect. No significant weakness but patient does have pain with internal rotation and flexion of the leg. Neurovascularly intact distally with 5 out of 5 strength.  Procedure: Real-time Ultrasound Guided Injection of  Device: GE Logiq E  Ultrasound guided injection is preferred based studies that show increased duration, increased effect, greater accuracy, decreased procedural pain, increased response rate, and decreased cost with ultrasound guided versus blind injection.  Verbal informed consent obtained.  Time-out conducted.   Noted no overlying erythema, induration, or other signs of local infection.  Skin prepped in a sterile fashion.  Local anesthesia: Topical Ethyl chloride.  With sterile technique and under real time ultrasound guidance:   Completed without difficulty  Pain immediately resolved suggesting accurate placement of the medication.  Advised to call if fevers/chills, erythema, induration, drainage, or persistent bleeding.  Images permanently stored and available for review in the ultrasound unit.  Impression: Technically successful ultrasound guided injection.      Impression and Recommendations:     This case required medical decision making of moderate complexity.      Note: This dictation was prepared with Dragon dictation along with smaller phrase technology. Any transcriptional errors that result from this process are unintentional.

## 2015-10-19 ENCOUNTER — Telehealth: Payer: Self-pay | Admitting: Emergency Medicine

## 2015-10-22 NOTE — Progress Notes (Signed)
[name] [address] [place], [place] [address] Phone: [phone] Subjective:     CC: right leg pain f/u  [id number]  Elizabeth [name] is a 55 y.o. female coming in with complaint of right leg pain. Patient states that she has had discomfort quite some time but recently got significant worse. Patient was trying to chase down a 20-year-old and felt a sharp pain on her right buttocks. Patient had more of a partial hamstring tear.  Patient was to start addressing patches, home exercises and compression.  Patient is elected to do PRP injection. Patient is now 2 weeks out from PRP.  Patient states  She is improving. Things like sitting is not as painful as it was previously. Still when she tries to increase her walking she has no discomfort. Not as severe as what it was previously though. Think she is making progress. Rates the severity pain now is 3 out of 10. Patient has been doing the exercises fairly regularly. Has not increased her activity though    Past Medical History:  Diagnosis Date  . Diabetes mellitus without complication ([place])   . Diverticulosis   . 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 Vaginal candidiasis 07/2015   after antibiotics   Past Surgical History:  Procedure Laterality Date  . COLONOSCOPY  2014  . LAPAROTOMY  1982  . MENISCUS REPAIR Left 1997  . TONSILLECTOMY  1966   Social History   Social History  . Marital status: Married    Spouse name: N/A  . Number of children: 3  . Years of education: N/A   Occupational History  . RN    Social History Main Topics  . Smoking status: Never Smoker  . Smokeless tobacco: Never Used  . Alcohol use 0.0 oz/week     Comment: couple glasses of wine a week  . Drug use: No  . Sexual activity: Not Asked   Other Topics Concern  . 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  Bee Venom   . Codeine   . Penicillins    Family History  Problem Relation Age of Onset  . Arthritis Mother   . Hypertension Father   . Diabetes Father   . Breast cancer Sister   . Heart disease Father     Past medical history, social, surgical and family history all reviewed in electronic medical record.  No pertanent information unless stated regarding to the chief complaint.   Review of Systems: No headache, visual changes, nausea, vomiting, diarrhea, constipation, dizziness, abdominal pain, skin rash, fevers, chills, night sweats, weight loss, swollen lymph nodes, body aches, joint swelling, muscle aches, chest pain, shortness of breath, mood changes.   Objective  Blood pressure 128/72, pulse 75, height 5\' 2"  (1.575 m), weight 122 lb (55.3 kg), last menstrual period 08/03/2010, SpO2 98 %.  General: No apparent distress alert and oriented x3 mood and affect normal, dressed appropriately.  HEENT: Pupils equal, extraocular movements intact  Respiratory: Patient's speak in full sentences and does not appear short of breath  Cardiovascular: No lower extremity edema, non tender, no erythema  Skin: Warm dry intact with no signs of infection or rash on extremities or on axial skeleton.  Abdomen: Soft nontender  Neuro: Cranial nerves II through XII are intact, neurovascularly intact in all extremities with  2+ DTRs and 2+ pulses.  Lymph: No lymphadenopathy of posterior or anterior cervical chain or axillae bilaterally.  Gait normal with good balance and coordination.  MSK:  Non tender with full range of motion and good stability and symmetric strength and tone of shoulders, elbows, wrist, hip, and ankles bilaterally.   right leg exam  No defect noted in the muscle. Nontender over the hamstring itself. Patient has full strength of the hamstring. Still some mild discomfort over the ischial tuberosity.  Limited muscular skeletal ultrasound was performed and interpreted by Lyndal Pulley    Limited ultrasound shows the patient's partial tear is significantly improved. Significant decrease in hypoechoic changes. Will patient did have possible avulsion fracture seems to be doing very well. Callus formation noted. Impression: Healing avulsion fracture. Impression tear    Impression and Recommendations:     This case required medical decision making of moderate complexity.      Note: This dictation was prepared with Dragon dictation along with smaller phrase technology. Any transcriptional errors that result from this process are unintentional.

## 2015-10-23 ENCOUNTER — Other Ambulatory Visit: Payer: Self-pay

## 2015-10-23 ENCOUNTER — Encounter: Payer: Self-pay | Admitting: Family Medicine

## 2015-10-23 ENCOUNTER — Ambulatory Visit (INDEPENDENT_AMBULATORY_CARE_PROVIDER_SITE_OTHER): Payer: BLUE CROSS/BLUE SHIELD | Admitting: Family Medicine

## 2015-10-23 VITALS — BP 128/72 | HR 75 | Ht 62.0 in | Wt 122.0 lb

## 2015-10-23 DIAGNOSIS — S76311S Strain of muscle, fascia and tendon of the posterior muscle group at thigh level, right thigh, sequela: Secondary | ICD-10-CM | POA: Diagnosis not present

## 2015-10-23 NOTE — Patient Instructions (Signed)
God to see you  Ice is your friend Compression should be good only with exercise now.  Keep doing everything else and PT will call you Have a great time Consider gabapentin  See me again in 3-4 weeks.

## 2015-10-23 NOTE — Assessment & Plan Note (Addendum)
Patient hamstring tear seems to be doing relatively well. We discussed icing regimen and home exercises. We discussed which activities to do an which was potentially avoid. Patient will start on formal physical therapy that I think will be beneficial at this point. Patient will follow-up with me otherwise in 4 weeks. At that time if doing well we will completely release her. Worsening symptoms we'll consider possible lumbar radiculopathy and starting gabapentin. Spent  25 minutes with patient face-to-face and had greater than 50% of counseling including as described above in assessment and plan.

## 2015-10-29 NOTE — Telephone Encounter (Signed)
a 

## 2015-10-30 ENCOUNTER — Ambulatory Visit: Payer: BLUE CROSS/BLUE SHIELD | Admitting: Family Medicine

## 2016-08-29 ENCOUNTER — Other Ambulatory Visit: Payer: Self-pay | Admitting: Obstetrics and Gynecology

## 2016-08-29 DIAGNOSIS — Z1231 Encounter for screening mammogram for malignant neoplasm of breast: Secondary | ICD-10-CM

## 2016-10-25 ENCOUNTER — Other Ambulatory Visit: Payer: Self-pay | Admitting: Obstetrics and Gynecology

## 2016-10-25 ENCOUNTER — Ambulatory Visit
Admission: RE | Admit: 2016-10-25 | Discharge: 2016-10-25 | Disposition: A | Discharge status: Home / Self Care | Payer: BLUE CROSS/BLUE SHIELD | Source: Ambulatory Visit | Attending: Obstetrics and Gynecology | Admitting: Obstetrics and Gynecology

## 2016-10-25 DIAGNOSIS — Z1231 Encounter for screening mammogram for malignant neoplasm of breast: Secondary | ICD-10-CM

## 2016-10-25 DIAGNOSIS — N63 Unspecified lump in unspecified breast: Secondary | ICD-10-CM

## 2016-10-25 HISTORY — DX: Unspecified lump in unspecified breast: N63.0

## 2017-12-21 ENCOUNTER — Encounter (HOSPITAL_COMMUNITY): Payer: Self-pay | Admitting: *Deleted

## 2017-12-21 ENCOUNTER — Other Ambulatory Visit: Payer: Self-pay

## 2017-12-21 NOTE — Anesthesia Preprocedure Evaluation (Addendum)
Anesthesia Evaluation  Patient identified by MRN, date of birth, ID band Patient awake    Reviewed: Allergy & Precautions, NPO status , Patient's Chart, lab work & pertinent test results  History of Anesthesia Complications (+) PONV  Airway Mallampati: I  TM Distance: >3 FB Neck ROM: Full    Dental  (+) Dental Advisory Given   Pulmonary neg pulmonary ROS,    breath sounds clear to auscultation       Cardiovascular negative cardio ROS   Rhythm:Regular Rate:Normal     Neuro/Psych negative neurological ROS     GI/Hepatic negative GI ROS, Neg liver ROS,   Endo/Other  diabetes (glu 111), Oral Hypoglycemic Agents, Insulin DependentHypothyroidism   Renal/GU negative Renal ROS     Musculoskeletal   Abdominal   Peds  Hematology negative hematology ROS (+)   Anesthesia Other Findings   Reproductive/Obstetrics                            Anesthesia Physical Anesthesia Plan  ASA: III  Anesthesia Plan: General   Post-op Pain Management:    Induction: Intravenous  PONV Risk Score and Plan: 3 and Ondansetron, Dexamethasone and Scopolamine patch - Pre-op  Airway Management Planned: LMA  Additional Equipment:   Intra-op Plan:   Post-operative Plan:   Informed Consent: I have reviewed the patients History and Physical, chart, labs and discussed the procedure including the risks, benefits and alternatives for the proposed anesthesia with the patient or authorized representative who has indicated his/her understanding and acceptance.   Dental advisory given  Plan Discussed with: CRNA and Surgeon  Anesthesia Plan Comments: (Plan routine monitors, GA- LMA OK)        Anesthesia Quick Evaluation

## 2017-12-21 NOTE — Progress Notes (Signed)
I instructed patient to check CBG after awaking and every 2 hours until arrival  to the hospital.  I Instructed patient if CBG is less than 70 to take 4 Glucose Tablets or 1 tube of Glucose Gel.  Recheck CBG in 15 minutes then call pre- op desk at [phone] for further instructions. If scheduled to receive Insulin, do not take Insulin.

## 2017-12-22 ENCOUNTER — Ambulatory Visit (HOSPITAL_COMMUNITY): Payer: BLUE CROSS/BLUE SHIELD | Admitting: Certified Registered Nurse Anesthetist

## 2017-12-22 ENCOUNTER — Encounter (HOSPITAL_COMMUNITY): Payer: Self-pay | Admitting: *Deleted

## 2017-12-22 ENCOUNTER — Encounter (HOSPITAL_COMMUNITY)
Admission: RE | Disposition: A | Discharge status: Home / Self Care | Payer: Self-pay | Source: Ambulatory Visit | Attending: Oculoplastics Ophthalmology

## 2017-12-22 ENCOUNTER — Ambulatory Visit (HOSPITAL_COMMUNITY)
Admission: RE | Admit: 2017-12-22 | Discharge: 2017-12-22 | Disposition: A | Discharge status: Home / Self Care | Payer: BLUE CROSS/BLUE SHIELD | Source: Ambulatory Visit | Attending: Oculoplastics Ophthalmology | Admitting: Oculoplastics Ophthalmology

## 2017-12-22 DIAGNOSIS — Z7984 Long term (current) use of oral hypoglycemic drugs: Secondary | ICD-10-CM | POA: Insufficient documentation

## 2017-12-22 DIAGNOSIS — E119 Type 2 diabetes mellitus without complications: Secondary | ICD-10-CM | POA: Diagnosis not present

## 2017-12-22 DIAGNOSIS — C441192 Basal cell carcinoma of skin of left lower eyelid, including canthus: Secondary | ICD-10-CM | POA: Diagnosis not present

## 2017-12-22 DIAGNOSIS — D22112 Melanocytic nevi of right lower eyelid, including canthus: Secondary | ICD-10-CM | POA: Diagnosis not present

## 2017-12-22 HISTORY — DX: Adverse effect of unspecified anesthetic, initial encounter: T41.45XA

## 2017-12-22 HISTORY — PX: LID LESION EXCISION: SHX5204

## 2017-12-22 HISTORY — DX: Hypothyroidism, unspecified: E03.9

## 2017-12-22 HISTORY — DX: Other complications of anesthesia, initial encounter: T88.59XA

## 2017-12-22 HISTORY — DX: Other specified postprocedural states: R11.2

## 2017-12-22 HISTORY — DX: Unspecified hearing loss, unspecified ear: H91.90

## 2017-12-22 HISTORY — DX: Nausea with vomiting, unspecified: Z98.890

## 2017-12-22 LAB — CBC
HCT: 42.1 % (ref 36.0–46.0)
HEMOGLOBIN: 13.8 g/dL (ref 12.0–15.0)
MCH: 30.3 pg (ref 26.0–34.0)
MCHC: 32.8 g/dL (ref 30.0–36.0)
MCV: 92.5 fL (ref 78.0–100.0)
Platelets: 214 10*3/uL (ref 150–400)
RBC: 4.55 MIL/uL (ref 3.87–5.11)
RDW: 11.9 % (ref 11.5–15.5)
WBC: 4.5 10*3/uL (ref 4.0–10.5)

## 2017-12-22 LAB — BASIC METABOLIC PANEL
Anion gap: 14 (ref 5–15)
BUN: 20 mg/dL (ref 6–20)
CHLORIDE: 100 mmol/L (ref 98–111)
CO2: 24 mmol/L (ref 22–32)
Calcium: 9.4 mg/dL (ref 8.9–10.3)
Creatinine, Ser: 0.82 mg/dL (ref 0.44–1.00)
GFR calc Af Amer: >60 mL/min (ref >60)
GFR calc non Af Amer: >60 mL/min (ref >60)
GLUCOSE: 118 mg/dL — AB (ref 70–99)
Potassium: 3.6 mmol/L (ref 3.5–5.1)
SODIUM: 138 mmol/L (ref 135–145)

## 2017-12-22 LAB — PROTIME-INR
INR: 0.97
Prothrombin Time: 12.8 seconds (ref 11.4–15.2)

## 2017-12-22 LAB — GLUCOSE, CAPILLARY
Glucose-Capillary: 111 mg/dL — ABNORMAL HIGH (ref 70–99)
Glucose-Capillary: 85 mg/dL (ref 70–99)

## 2017-12-22 SURGERY — EXCISION, LESION, EYELID
Anesthesia: General | Laterality: Left

## 2017-12-22 MED ORDER — MEPERIDINE HCL 50 MG/ML IJ SOLN: 6.2500 mg | INTRAMUSCULAR | Status: DC | PRN

## 2017-12-22 MED ORDER — TOBRAMYCIN-DEXAMETHASONE 0.3-0.1 % OP OINT
TOPICAL_OINTMENT | OPHTHALMIC | Status: AC
  Filled 2017-12-22: qty 3.5

## 2017-12-22 MED ORDER — PROMETHAZINE HCL 6.25 MG/5ML PO SYRP: 25.0000 mg | ORAL_SOLUTION | Freq: Every day | ORAL | 1 refills | Status: DC

## 2017-12-22 MED ORDER — OXYCODONE HCL 5 MG PO TABS
5.0000 mg | ORAL_TABLET | Freq: Once | ORAL | Status: AC
  Administered 2017-12-22: 5 mg via ORAL

## 2017-12-22 MED ORDER — LIDOCAINE-EPINEPHRINE 1 %-1:100000 IJ SOLN
INTRAMUSCULAR | Status: AC
  Filled 2017-12-22: qty 1

## 2017-12-22 MED ORDER — LACTATED RINGERS IV SOLN
INTRAVENOUS | Status: DC | PRN
  Administered 2017-12-22: 07:00:00 via INTRAVENOUS

## 2017-12-22 MED ORDER — KETOROLAC TROMETHAMINE 30 MG/ML IJ SOLN
INTRAMUSCULAR | Status: AC
  Filled 2017-12-22: qty 1

## 2017-12-22 MED ORDER — PROPOFOL 10 MG/ML IV BOLUS
INTRAVENOUS | Status: DC | PRN
  Administered 2017-12-22: 200 mg via INTRAVENOUS

## 2017-12-22 MED ORDER — SCOPOLAMINE 1 MG/3DAYS TD PT72
MEDICATED_PATCH | TRANSDERMAL | Status: DC | PRN
  Administered 2017-12-22: 1 via TRANSDERMAL

## 2017-12-22 MED ORDER — ACETAMINOPHEN 10 MG/ML IV SOLN
INTRAVENOUS | Status: AC
  Administered 2017-12-22: 1000 mg
  Filled 2017-12-22: qty 100

## 2017-12-22 MED ORDER — OXYCODONE HCL 5 MG PO TABS
ORAL_TABLET | ORAL | Status: AC
  Administered 2017-12-22: 5 mg via ORAL
  Filled 2017-12-22: qty 1

## 2017-12-22 MED ORDER — FENTANYL CITRATE (PF) 250 MCG/5ML IJ SOLN
INTRAMUSCULAR | Status: DC | PRN
  Administered 2017-12-22 (×2): 25 ug via INTRAVENOUS

## 2017-12-22 MED ORDER — FENTANYL CITRATE (PF) 100 MCG/2ML IJ SOLN: 25.0000 ug | INTRAMUSCULAR | Status: DC | PRN

## 2017-12-22 MED ORDER — BUPIVACAINE HCL (PF) 0.5 % IJ SOLN
INTRAMUSCULAR | Status: AC
  Filled 2017-12-22: qty 10

## 2017-12-22 MED ORDER — PREDNISONE 10 MG PO TABS: 20.0000 mg | ORAL_TABLET | Freq: Every day | ORAL | 0 refills | Status: DC

## 2017-12-22 MED ORDER — ONDANSETRON HCL 4 MG/2ML IJ SOLN
INTRAMUSCULAR | Status: DC | PRN
  Administered 2017-12-22: 4 mg via INTRAVENOUS

## 2017-12-22 MED ORDER — KETOROLAC TROMETHAMINE 30 MG/ML IJ SOLN
30.0000 mg | Freq: Once | INTRAMUSCULAR | Status: AC
  Administered 2017-12-22: 30 mg via INTRAVENOUS

## 2017-12-22 MED ORDER — PROPOFOL 10 MG/ML IV BOLUS
INTRAVENOUS | Status: AC
  Filled 2017-12-22: qty 40

## 2017-12-22 MED ORDER — LIDOCAINE HCL (CARDIAC) PF 100 MG/5ML IV SOSY
PREFILLED_SYRINGE | INTRAVENOUS | Status: DC | PRN
  Administered 2017-12-22: 40 mg via INTRAVENOUS

## 2017-12-22 MED ORDER — PROMETHAZINE HCL 25 MG/ML IJ SOLN: 6.2500 mg | INTRAMUSCULAR | Status: DC | PRN

## 2017-12-22 MED ORDER — MIDAZOLAM HCL 5 MG/5ML IJ SOLN
INTRAMUSCULAR | Status: DC | PRN
  Administered 2017-12-22: 2 mg via INTRAVENOUS

## 2017-12-22 MED ORDER — PHENYLEPHRINE 40 MCG/ML (10ML) SYRINGE FOR IV PUSH (FOR BLOOD PRESSURE SUPPORT)
PREFILLED_SYRINGE | INTRAVENOUS | Status: DC | PRN
  Administered 2017-12-22: 40 ug via INTRAVENOUS
  Administered 2017-12-22: 80 ug via INTRAVENOUS
  Administered 2017-12-22: 40 ug via INTRAVENOUS

## 2017-12-22 MED ORDER — DEXAMETHASONE SODIUM PHOSPHATE 4 MG/ML IJ SOLN
INTRAMUSCULAR | Status: DC | PRN
  Administered 2017-12-22: 4 mg via INTRAVENOUS

## 2017-12-22 MED ORDER — ACETAMINOPHEN 500 MG PO TABS: 1000.0000 mg | ORAL_TABLET | Freq: Four times a day (QID) | ORAL | 0 refills | Status: AC

## 2017-12-22 MED ORDER — FENTANYL CITRATE (PF) 250 MCG/5ML IJ SOLN
INTRAMUSCULAR | Status: AC
  Filled 2017-12-22: qty 5

## 2017-12-22 MED ORDER — MIDAZOLAM HCL 2 MG/2ML IJ SOLN
INTRAMUSCULAR | Status: AC
  Filled 2017-12-22: qty 2

## 2017-12-22 MED ORDER — TETRACAINE HCL 0.5 % OP SOLN
OPHTHALMIC | Status: AC
  Filled 2017-12-22: qty 4

## 2017-12-22 MED ORDER — MIDAZOLAM HCL 2 MG/2ML IJ SOLN: 0.5000 mg | Freq: Once | INTRAMUSCULAR | Status: DC | PRN

## 2017-12-22 MED ORDER — LIDOCAINE-EPINEPHRINE 1 %-1:100000 IJ SOLN
INTRAMUSCULAR | Status: DC | PRN
  Administered 2017-12-22: 10 mL via INTRAMUSCULAR

## 2017-12-22 MED ORDER — TOBRAMYCIN-DEXAMETHASONE 0.3-0.1 % OP OINT
TOPICAL_OINTMENT | OPHTHALMIC | Status: DC | PRN
  Administered 2017-12-22: 1 via OPHTHALMIC

## 2017-12-22 SURGICAL SUPPLY — 43 items
APL SRG 3 HI ABS STRL LF PLS (MISCELLANEOUS) ×2
APL SWBSTK 6 STRL LF DISP (MISCELLANEOUS) ×4
APPLICATOR COTTON TIP 6 STRL (MISCELLANEOUS) ×4 IMPLANT
APPLICATOR COTTON TIP 6IN STRL (MISCELLANEOUS) ×6
APPLICATOR DR MATTHEWS STRL (MISCELLANEOUS) ×3 IMPLANT
BANDAGE EYE OVAL (MISCELLANEOUS) IMPLANT
BLADE SURG 15 STRL LF DISP TIS (BLADE) ×2 IMPLANT
BLADE SURG 15 STRL SS (BLADE) ×3
BNDG EYE OVAL (GAUZE/BANDAGES/DRESSINGS) ×4 IMPLANT
CLSR STERI-STRIP ANTIMIC 1/2X4 (GAUZE/BANDAGES/DRESSINGS) ×3 IMPLANT
CORD BIPOLAR FORCEPS 12FT (ELECTRODE) ×3 IMPLANT
COVER LIGHT HANDLE STERIS (MISCELLANEOUS) ×3 IMPLANT
COVER SURGICAL LIGHT HANDLE (MISCELLANEOUS) ×3 IMPLANT
COVER WAND RF STERILE (DRAPES) ×3 IMPLANT
DRAPE ORTHO SPLIT 87X125 STRL (DRAPES) ×3 IMPLANT
DRESSING TELFA 8X10 (GAUZE/BANDAGES/DRESSINGS) IMPLANT
ELECT NDL BLADE 2-5/6 (NEEDLE) ×1 IMPLANT
ELECT NEEDLE BLADE 2-5/6 (NEEDLE) ×3 IMPLANT
ELECT REM PT RETURN 9FT ADLT (ELECTROSURGICAL) ×3
ELECTRODE REM PT RTRN 9FT ADLT (ELECTROSURGICAL) ×2 IMPLANT
FORCEPS BIPOLAR SPETZLER 8 1.0 (NEUROSURGERY SUPPLIES) IMPLANT
FRAME EYE SHIELD (PROTECTIVE WEAR) IMPLANT
GLOVE BIO SURGEON STRL SZ7.5 (GLOVE) ×6 IMPLANT
GOWN STRL REUS W/ TWL LRG LVL3 (GOWN DISPOSABLE) ×4 IMPLANT
GOWN STRL REUS W/TWL LRG LVL3 (GOWN DISPOSABLE) ×6
KIT BASIN OR (CUSTOM PROCEDURE TRAY) ×3 IMPLANT
NDL PRECISIONGLIDE 27X1.5 (NEEDLE) IMPLANT
NEEDLE PRECISIONGLIDE 27X1.5 (NEEDLE) IMPLANT
NS IRRIG 1000ML POUR BTL (IV SOLUTION) ×3 IMPLANT
PACK CATARACT CUSTOM (CUSTOM PROCEDURE TRAY) ×3 IMPLANT
PAD ARMBOARD 7.5X6 YLW CONV (MISCELLANEOUS) ×6 IMPLANT
PENCIL BUTTON HOLSTER BLD 10FT (ELECTRODE) ×3 IMPLANT
SUT CHROMIC 5 0 P 3 (SUTURE) IMPLANT
SUT ETHILON 6 0 P 1 (SUTURE) IMPLANT
SUT ETHILON 7 0 P 6 18 (SUTURE) IMPLANT
SUT MERSILENE 5 0 RD 1 DA (SUTURE) ×2 IMPLANT
SUT PLAIN 5 0 P 3 18 (SUTURE) IMPLANT
SUT PLAIN 6 0 TG1408 (SUTURE) IMPLANT
SUT SILK 4 0 P 3 (SUTURE) ×2 IMPLANT
SUT VICRYL 6-0 S14 (SUTURE) IMPLANT
TAPE STRIPS DRAPE STRL (GAUZE/BANDAGES/DRESSINGS) ×2 IMPLANT
TOWEL NATURAL 6PK STERILE (DISPOSABLE) ×3 IMPLANT
WATER STERILE IRR 1000ML POUR (IV SOLUTION) ×3 IMPLANT

## 2017-12-22 NOTE — Anesthesia Procedure Notes (Signed)
Procedure Name: LMA Insertion Date/Time: 12/22/2017 7:47 AM Performed by: Glynda Jaeger, CRNA Pre-anesthesia Checklist: Patient identified, Emergency Drugs available, Suction available and Patient being monitored Patient Re-evaluated:Patient Re-evaluated prior to induction Oxygen Delivery Method: Circle System Utilized Preoxygenation: Pre-oxygenation with 100% oxygen Induction Type: IV induction Ventilation: Mask ventilation without difficulty LMA: LMA inserted LMA Size: 4.0 Number of attempts: 1 Airway Equipment and Method: Bite block Placement Confirmation: positive ETCO2 Tube secured with: Tape Dental Injury: Teeth and Oropharynx as per pre-operative assessment

## 2017-12-22 NOTE — Transfer of Care (Signed)
Immediate Anesthesia Transfer of Care Note  Patient: Elizabeth [name]  Procedure(s) Performed: LID LESION EXCISION with reconstruction of lower   left and right eye lids (Left )  Patient Location: PACU  Anesthesia Type:General  Level of Consciousness: awake, alert , oriented, patient cooperative and responds to stimulation  Airway & Oxygen Therapy: Patient Spontanous Breathing and Patient connected to nasal cannula oxygen  Post-op Assessment: Report given to RN, Post -op Vital signs reviewed and stable and Patient moving all extremities X 4  Post vital signs: Reviewed and stable  Last Vitals:  Vitals Value Taken Time  BP 150/83 12/22/2017  9:07 AM  Temp    Pulse 84 12/22/2017  9:09 AM  Resp 13 12/22/2017  9:09 AM  SpO2 98 % 12/22/2017  9:09 AM  Vitals shown include unvalidated device data.  Last Pain: There were no vitals filed for this visit.       Complications: No apparent anesthesia complications

## 2017-12-22 NOTE — Anesthesia Postprocedure Evaluation (Signed)
Anesthesia Post Note  Patient: Elizabeth [name]  Procedure(s) Performed: LID LESION EXCISION with reconstruction of lower   left and right eye lids (Left )     Patient location during evaluation: PACU Anesthesia Type: General Level of consciousness: awake and alert, oriented and patient cooperative Pain management: pain level controlled Vital Signs Assessment: post-procedure vital signs reviewed and stable Respiratory status: spontaneous breathing, nonlabored ventilation, respiratory function stable and patient connected to nasal cannula oxygen Cardiovascular status: blood pressure returned to baseline and stable Postop Assessment: no apparent nausea or vomiting Anesthetic complications: no    Last Vitals:  Vitals:   12/22/17 0940 12/22/17 0955  BP: (!) 149/81 (!) 149/77  Pulse: 72 75  Resp: 11 14  Temp:    SpO2: 99% 99%    Last Pain:  Vitals:   12/22/17 1030  PainSc: 5                  [name],E. Tremont Gavitt

## 2017-12-22 NOTE — Op Note (Signed)
Procedure(s): LID LESION EXCISION with total reconstruction of lower left eye lid, frozen section Lid lesion excision with total reconstruction of the right lower eyelid, permanent section  Elizabeth [name] female 57 y.o. 12/22/2017  Procedure(s) and Anesthesia Type:    * LID LESION EXCISION with reconstruction of lower left eye lid - General    * SKIN GRAFT FULL THICKNESS - General  Surgeon(s) and Role:    * Clista Bernhardt, MD - Primary     Surgeon: Clista Bernhardt   Assistants: none  Anesthesia: General endotracheal anesthesia and Local anesthesia 1% buffered lidocaine, 0.25.% bupivacaine, with epinephrine  ASA Class: 2   Procedure Detail  LID LESION EXCISION with reconstruction of lower left eye lid, frozen section Lid lesion excision with reconstruction of the right lower eyelid, permanent section  Indications: The patient is aware that they have a basal cell cancer, necessitating excision. The patient is aware of the risks and benefits of surgery including bleeding, infection, scarring, need for re-excision, asymmetry, and loss of the eye, and loss of life. The patient elects to proceed.  The patient was transferred to the OR in supine position where they were prepped and draped in the usual standard sterile fashion for Oculoplastic surgery at [place]. Attention was turned to the left lower eyelid which was then infiltrated with local anesthesia. The area of visible basal cell was incised with a 15 blade in a horizontal ellipsoid and then excised with a [place]otts scissors, hemostasis was maintained with monopolar cautery. This was sent for frozen section.  Then attention was drawn to the right lower eyelid. The area of interest was excised and this was sent for permanent section.         Attention was then directed to the right lower eyelid which had been previously injected with the standard oculoplastic block.  A standard bovie incision was performed after 4-0 silk  nylon suture was placed through the gray line to allow for superior retraction of the lower eyelid. The dissection was carried out to the level of the fat pads to allow for complete mobilization of the lower eyelid to aide in the reconstruction. The fat was sculpted and repositioned as needed to achieve excellent contour. The periosteum was released at the level of the arcus marginalis to ablate the demarcation and improve the lower eyelid appearance. Hemostasis was achieved with a combination of monopolar and bipolar cautery.  A 4-0 dyed vicryl suture was taken through the orbicularis beneath the lateral canthus and the secured within the blepharoplasty incision to the periosteum of the lateral orbital rim. The same procedure was performed on the contralateral eye.   The globe protectors were removed.  No lagophthalmos was present. The patient tolerated the procedure well and returned to the post anesthesia care unit in good condition.   Findings: none  Estimated Blood Loss:  Minimal         Drains: none         Total IV Fluids: <2L  Blood Given: none          Specimens: Left Lower Eyelid 5x66mm frozen, Right Lower Eyelid 2x22mm permanent         Implants: none        Complications:  * No complications entered in OR log *         Disposition: PACU - hemodynamically stable.         Condition: stable

## 2017-12-22 NOTE — Brief Op Note (Addendum)
12/22/2017  7:19 AM  PATIENT:  Elizabeth [name]  57 y.o. 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  POST-OPERATIVE DIAGNOSIS:  * No post-op diagnosis entered *  PROCEDURE:  Procedure(s) with comments: LID LESION EXCISION with reconstruction of lower left eye lid (Left) - Frozen sections Lid lesion excision with reconstruction of the right lower eyelid- permanent section  SURGEON:  Surgeon(s) and Role:    * Abugo, Peyton Najjar, MD - Primary  PHYSICIAN ASSISTANT: none  ASSISTANTS: none   ANESTHESIA:   general and lidocaine and bupivicaine with epinephrine  EBL:  Minimal   BLOOD ADMINISTERED:none  DRAINS: none   LOCAL MEDICATIONS USED:  BUPIVICAINE  and LIDOCAINE   SPECIMEN:  Source of Specimen:  Right Lower Eyelid(Permanent) Size 2x71mm and Left Lower Lid Frozen Size  5x89mm  DISPOSITION OF SPECIMEN:  PATHOLOGY  COUNTS:  YES  TOURNIQUET:  * No tourniquets in log *  DICTATION: .Note written in EPIC  PLAN OF CARE: Discharge to home after PACU  PATIENT DISPOSITION:  PACU - hemodynamically stable.   Delay start of Pharmacological VTE agent (>24hrs) due to surgical blood loss or risk of bleeding: no

## 2017-12-22 NOTE — Interval H&P Note (Signed)
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 After consideration of risks, benefits and other options for treatment, the patient has consented to  Procedure(s) with comments: LID LESION EXCISION with reconstruction of lower left eye lid (Left) - Frozen sections SKIN GRAFT FULL THICKNESS (N/A) as a surgical intervention .  The patient's history has been reviewed, patient examined, no change in status, stable for surgery.  I have reviewed the patient's chart and labs.  Questions were answered to the patient's satisfaction.     Clista Bernhardt

## 2017-12-22 NOTE — Discharge Instructions (Addendum)
KEEP DRESSINGS IN PLACE UNTIL THE AM  Take Norco 10/325 MG 1 tab orally every 6 hrs for the first day(If allergic to codeine please take tylenol 1000mg  every 6 hrs for the first 3 days). Take Prednisone 20mg  orally every day with food. Take Phenergan 25mg  at night for the first 2 nights after surgery.   *Received Tylenol 1000mg  at 945 AM!! *Toradol 30mg  (Advil/Ibuprofen) at 1030 AM!! *Oxy 5mg  at 1115 AM!!

## 2017-12-22 NOTE — H&P (Signed)
Subjective:    Elizabeth [name] is a 57 y.o. female who presents for evaluation of basal cell cancer left lower eyelid and pigmented lesion right lower eyelid . The pain is described as none. Onset was several months ago. Symptoms have been unchanged since.   Review of Systems Pertinent items are noted in HPI.    Objective:   Ht 5' 2.5" (1.588 m)   Wt 53.5 kg   LMP 08/03/2010   BMI 21.24 kg/m   General:  alert, cooperative and appears stated age Skin:  normal Eyes: positive findings: eyelids/periorbital: left lower eyelid s/p incisional biopsy, right lower eyelid with medial lower eyelid pigmented lesion , healing well Mouth: MMM no lesions Lymph Nodes:  Cervical, supraclavicular, and axillary nodes normal. Lungs:  clear to auscultation bilaterally Heart:  regular rate and rhythm, S1, S2 normal, no murmur, click, rub or gallop Abdomen: soft, non-tender; bowel sounds normal; no masses,  no organomegaly CVA:  absent Genitourinary: defer exam Extremities:  extremities normal, atraumatic, no cyanosis or edema Neurologic:  negative Psychiatric:  normal mood, behavior, speech, dress, and thought processes    Assessment: Left Basal Cell Cancer of Lower Eyelid and Right Pigmented lesion of the lower eyelid    Plan: Excisonal Biopsy of the Left Lower Eyelid with Frozen Section, total reconstruction of the left lower eyelid. Excisional Biopsy of the Right Lower Eyelid, Possible graft placement.   1. Discussed the risk of surgery,  and the risks of general anesthetic including MI, CVA, sudden death or even reaction to anesthetic medications. The patient understands the risks, any and all questions were answered to the patient's satisfaction. 2. Follow up: 1 week.  Date of Surgery Update (To be completed by Attending Surgeon day of surgery.)

## 2017-12-23 ENCOUNTER — Encounter (HOSPITAL_COMMUNITY): Payer: Self-pay | Admitting: Oculoplastics Ophthalmology

## 2018-08-14 NOTE — Progress Notes (Signed)
[name] [address], [place] [address] Phone: [phone] Subjective:   I Elizabeth [name] am serving as a Education administrator for Dr. Hulan Saas.    CC: Neck pain  DGL:[id number]  Elizabeth [name] is a 58 y.o. female coming in with complaint of neck pain. Hand go numb with certain ADLs. One of her cervical vertebrea is sore. Has to switch pillows in the middle of the night.   Onset- 6-8 weeks  Location- middle of her neck C6-C7 Duration-  Character- Aggravating factors- flexion, holding cell phone, sleeping Reliving factors-  Therapies tried- motron 3x a day, ice  Severity-7 out of 10     Past Medical History:  Diagnosis Date  . Breast mass 10/25/2016   Left breast mass x months larger than a pea , MRI of Breast 10/2017 - no problem  . Complication of anesthesia   . 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 Diverticulosis   . HOH (hard of hearing)    Bilateral  . Hypothyroidism   . PONV (postoperative nausea and vomiting)   . 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 Vaginal candidiasis 07/2015   after antibiotics   Past Surgical History:  Procedure Laterality Date  . AUGMENTATION MAMMAPLASTY Bilateral 2004  . COLONOSCOPY  2014  . HYSTEROTOMY     Robotic  . LAPAROTOMY  1982  . LID LESION EXCISION Left 12/22/2017   Procedure: LID LESION EXCISION with reconstruction of lower   left and right eye lids;  Surgeon: Clista Bernhardt, MD;  Location: [facility];  Service: Ophthalmology;  Laterality: Left;  Frozen sections  . MENISCUS REPAIR Left 1997  . TONSILLECTOMY  1966   Social History   Socioeconomic History  . Marital status: Married    Spouse name: Not on file  . Number of children: 3  . Years of education: Not on file  . Highest education level: Not on file  Occupational History  . Occupation: Therapist, sports  Social Needs  . Financial resource strain: Not on file  . Food insecurity:     Worry: Not on file    Inability: Not on file  . Transportation needs:    Medical: Not on file    Non-medical: Not on file  Tobacco Use  . Smoking status: Never Smoker  . Smokeless tobacco: Never Used  Substance and Sexual Activity  . Alcohol use: Yes    Alcohol/week: 1.0 standard drinks    Types: 1 Glasses of wine per week  . Drug use: No  . Sexual activity: Not on file  Lifestyle  . Physical activity:    Days per week: Not on file    Minutes per session: Not on file  . Stress: Not on file  Relationships  . Social connections:    Talks on phone: Not on file    Gets together: Not on file    Attends religious service: Not on file    Active member of club or organization: Not on file    Attends meetings of clubs or organizations: Not on file    Relationship status: Not on file  Other Topics Concern  . 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 Bee Venom Anaphylaxis  . Penicillins Rash and Other (  See Comments)    PATIENT HAS HAD A PCN REACTION WITH IMMEDIATE RASH, FACIAL/TONGUE/THROAT SWELLING, SOB, OR LIGHTHEADEDNESS WITH HYPOTENSION:  #  #  YES  #  #  Has patient had a PCN reaction causing severe rash involving mucus membranes or skin necrosis: no Has patient had a PCN reaction that required hospitalization: no Has patient had a PCN reaction occurring within the last 10 years: no If all of the above answers are "NO", then may proceed with Cephalosporin use.   . Codeine Nausea And Vomiting  . Oxycodone Nausea And Vomiting   Family History  Problem Relation Age of Onset  . Arthritis Mother   . Hypertension Father   . Diabetes Father   . Heart disease Father   . Breast cancer Sister     Current Outpatient Medications (Endocrine & Metabolic):  .  insulin aspart (NOVOLOG FLEXPEN) 100 UNIT/ML FlexPen, Inject 2 Units into the skin daily with supper.  .  insulin degludec (TRESIBA FLEXTOUCH) 100 UNIT/ML SOPN FlexTouch Pen, Inject 10 Units into the skin every evening.  .  metFORMIN (GLUCOPHAGE) 500 MG tablet, Take 500 mg by mouth 2 (two) times daily.  .  predniSONE (DELTASONE) 10 MG tablet, Take 2 tablets (20 mg total) by mouth daily with breakfast. .  PROGESTERONE MICRONIZED PO, Take 175 mg by mouth daily. **Compounded med .  thyroid (NATURE-THROID) 65 MG tablet, Take 65 mg by mouth daily. 1 grain = 64.8 mg   Current Outpatient Medications (Respiratory):  .  promethazine (PHENERGAN) 6.25 MG/5ML syrup, Take 20 mLs (25 mg total) by mouth at bedtime for 2 days.  Current Outpatient Medications (Analgesics):  .  meloxicam (MOBIC) 15 MG tablet, Take 1 tablet (15 mg total) by mouth daily.   Current Outpatient Medications (Other):  Marland Kitchen  Multiple Vitamins-Minerals (MULTIVITAMIN WITH MINERALS) tablet, Take 1 tablet by mouth daily. Marland Kitchen  gabapentin (NEURONTIN) 100 MG capsule, Take 2 capsules (200 mg total) by mouth at bedtime.    Past medical history, social, surgical and family history all reviewed in electronic medical record.  No pertanent information unless stated regarding to the chief complaint.   Review of Systems:  No headache, visual changes, nausea, vomiting, diarrhea, constipation, dizziness, abdominal pain, skin rash, fevers, chills, night sweats, weight loss, swollen lymph nodes, body aches, joint swelling,  chest pain, shortness of breath, mood changes.  Positive muscle aches  Objective  Blood pressure (!) 154/80, pulse 72, height 5\' 2"  (1.575 m), weight 122 lb (55.3 kg), last menstrual period 08/03/2010, SpO2 98 %.    General: No apparent distress alert and oriented x3 mood and affect normal, dressed appropriately.  HEENT: Pupils equal, extraocular movements intact  Respiratory: Patient's speak in full sentences and does not appear short of breath  Cardiovascular: No lower extremity edema, non tender, no erythema  Skin: Warm dry  intact with no signs of infection or rash on extremities or on axial skeleton.  Abdomen: Soft nontender  Neuro: Cranial nerves II through XII are intact, neurovascularly intact in all extremities with 2+ DTRs and 2+ pulses.  Lymph: No lymphadenopathy of posterior or anterior cervical chain or axillae bilaterally.  Gait normal with good balance and coordination.  MSK:  Non tender with full range of motion and good stability and symmetric strength and tone of shoulders, elbows, wrist, hip, knee and ankles bilaterally.  Neck: Inspection mild loss of lordosis.  Patient has more pain with extension of the neck. No palpable stepoffs. Negative Spurling's maneuver. Full neck range  of motion more pain with extension greater than 15 degrees Grip strength and sensation normal in bilateral hands Strength good C4 to T1 distribution No sensory change to C4 to T1 Negative Hoffman sign bilaterally Reflexes normal Tightness of the trapezius bilaterally  Osteopathic findings  C6 flexed rotated and side bent left T3 extended rotated and side bent right inhaled third rib T9 extended rotated and side bent left L2 flexed rotated and side bent right Sacrum right on right    Impression and Recommendations:     This case required medical decision making of moderate complexity. The above documentation has been reviewed and is accurate and complete Lyndal Pulley, DO       Note: This dictation was prepared with Dragon dictation along with smaller phrase technology. Any transcriptional errors that result from this process are unintentional.

## 2018-08-15 ENCOUNTER — Encounter: Payer: Self-pay | Admitting: Family Medicine

## 2018-08-15 ENCOUNTER — Ambulatory Visit (INDEPENDENT_AMBULATORY_CARE_PROVIDER_SITE_OTHER)
Admission: RE | Admit: 2018-08-15 | Discharge: 2018-08-15 | Disposition: A | Discharge status: Home / Self Care | Payer: BLUE CROSS/BLUE SHIELD | Source: Ambulatory Visit | Attending: Family Medicine | Admitting: Family Medicine

## 2018-08-15 ENCOUNTER — Ambulatory Visit: Payer: Self-pay

## 2018-08-15 ENCOUNTER — Other Ambulatory Visit: Payer: Self-pay

## 2018-08-15 ENCOUNTER — Ambulatory Visit (INDEPENDENT_AMBULATORY_CARE_PROVIDER_SITE_OTHER): Payer: BLUE CROSS/BLUE SHIELD | Admitting: Family Medicine

## 2018-08-15 VITALS — BP 154/80 | HR 72 | Ht 62.0 in | Wt 122.0 lb

## 2018-08-15 DIAGNOSIS — M999 Biomechanical lesion, unspecified: Secondary | ICD-10-CM | POA: Diagnosis not present

## 2018-08-15 DIAGNOSIS — M542 Cervicalgia: Secondary | ICD-10-CM | POA: Diagnosis not present

## 2018-08-15 DIAGNOSIS — M25519 Pain in unspecified shoulder: Secondary | ICD-10-CM | POA: Diagnosis not present

## 2018-08-15 MED ORDER — MELOXICAM 15 MG PO TABS: 15.0000 mg | ORAL_TABLET | Freq: Every day | ORAL | 0 refills | Status: DC

## 2018-08-15 MED ORDER — GABAPENTIN 100 MG PO CAPS: 200.0000 mg | ORAL_CAPSULE | Freq: Every day | ORAL | 3 refills | Status: DC

## 2018-08-15 NOTE — Patient Instructions (Signed)
Good to see you  Xray downstairs  Ice 20 minutes 2 times daily. Usually after activity and before bed. Exercises 3 times a week.  PT will be calling you  Stop the ibuprofen Start meloxicam daily for 10 days then as needed Gabapentin 200mg  at night for nerve pain  B complex daiy with B12 1010mcg daily and 100mg  of B6  See me again in 3 weeks

## 2018-08-15 NOTE — Assessment & Plan Note (Signed)
Neck pain.  Seems to be muscular but there is some mild midline tenderness.  X-rays ordered today.  We discussed that with no radicular symptoms that I do not feel advanced imaging will be warranted.  We discussed anti-inflammatories, icing regimen, home exercises, we discussed ergonomics.  In addition to this we discussed gabapentin which was sent in today.  Do not believe this is peripheral neuropathy.  Patient responded fairly well to osteopathic manipulation.  Follow-up again 4 to 6 weeks

## 2018-08-15 NOTE — Assessment & Plan Note (Signed)
Decision today to treat with OMT was based on Physical Exam  After verbal consent patient was treated with HVLA, ME, FPR techniques in cervical, thoracic, rib,  lumbar and sacral areas  Patient tolerated the procedure well with improvement in symptoms  Patient given exercises, stretches and lifestyle modifications  See medications in patient instructions if given  Patient will follow up in 4-8 weeks 

## 2018-08-20 ENCOUNTER — Encounter: Payer: Self-pay | Admitting: Family Medicine

## 2018-08-22 ENCOUNTER — Ambulatory Visit: Payer: BC Managed Care – PPO | Attending: Family Medicine | Admitting: Physical Therapy

## 2018-08-22 ENCOUNTER — Other Ambulatory Visit: Payer: Self-pay

## 2018-08-22 ENCOUNTER — Encounter: Payer: Self-pay | Admitting: Physical Therapy

## 2018-08-22 DIAGNOSIS — M5412 Radiculopathy, cervical region: Secondary | ICD-10-CM

## 2018-08-22 DIAGNOSIS — M542 Cervicalgia: Secondary | ICD-10-CM | POA: Insufficient documentation

## 2018-08-22 DIAGNOSIS — R252 Cramp and spasm: Secondary | ICD-10-CM | POA: Diagnosis present

## 2018-08-22 NOTE — Patient Instructions (Signed)

## 2018-08-22 NOTE — Therapy (Signed)
[facility] [place], [place], [address] Phone: [phone]   Fax:  [phone]  Physical Therapy Evaluation  Patient Details  Name: Elizabeth [name] MRN: [id number] Date of Birth: Jan 02, 1961 Referring Provider (PT): Creig Hines   Encounter Date: 08/22/2018  PT End of Session - 08/22/18 0917    Visit Number  1    Date for PT Re-Evaluation  10/22/18    PT Start Time  0844    PT Stop Time  0930    PT Time Calculation (min)  46 min    Activity Tolerance  Patient tolerated treatment well    Behavior During Therapy  [facility] for tasks assessed/performed       Past Medical History:  Diagnosis Date  . Breast mass 10/25/2016   Left breast mass x months larger than a pea , MRI of Breast 10/2017 - no problem  . Complication of anesthesia   . 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 Diverticulosis   . HOH (hard of hearing)    Bilateral  . Hypothyroidism   . PONV (postoperative nausea and vomiting)   . 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 Vaginal candidiasis 07/2015   after antibiotics    Past Surgical History:  Procedure Laterality Date  . AUGMENTATION MAMMAPLASTY Bilateral 2004  . COLONOSCOPY  2014  . HYSTEROTOMY     Robotic  . LAPAROTOMY  1982  . LID LESION EXCISION Left 12/22/2017   Procedure: LID LESION EXCISION with reconstruction of lower   left and right eye lids;  Surgeon: Clista Bernhardt, MD;  Location: [facility];  Service: Ophthalmology;  Laterality: Left;  Frozen sections  . MENISCUS REPAIR Left 1997  . TONSILLECTOMY  1966    There were no vitals filed for this visit.   Subjective Assessment - 08/22/18 0849    Subjective  Patient reports that she started noticing numbenss in the hands about 8 weeks ago, she has since started having neck and shoulder pain on the right.  She is a Marine scientist.  X-rays show some DDD in the cervical spine     Limitations  Lifting;House hold activities;Writing;Reading    Patient Stated Goals  have less pain    Currently in Pain?  Yes    Pain Score  2     Pain Location  Neck    Pain Descriptors / Indicators  Tightness;Numbness    Pain Radiating Towards  numbness in hands, right worse than the left    Pain Onset  More than a month ago    Pain Frequency  Constant    Aggravating Factors   difficulty sleeping, sitting, laying down pain up to 6/10, at times in the morning numbness is "terrible"    Pain Relieving Factors  moving around, ice helps, pain can be 0/10    Effect of Pain on Daily Activities  difficulty sleeping, using a phone, sitting at computer         [facility] PT Assessment - 08/22/18 0001      Assessment   Medical Diagnosis  neck pain    Referring Provider (PT)  Z. Smith    Onset Date/Surgical Date  07/22/18    Hand Dominance  Right    Prior Therapy  n      Precautions   Precautions  None      Balance Screen   Has the patient fallen  in the past 6 months  No    Has the patient had a decrease in activity level because of a fear of falling?   No    Is the patient reluctant to leave their home because of a fear of falling?   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       Prior Function   Level of Independence  Independent    Vocation  Full time employment    Occupational psychologist on NICU    Leisure  gym 3x/week, some weights,       Posture/Postural Control   Posture Comments  mild fwd head, rounded shoulders      ROM / Strength   AROM / PROM / Strength  AROM;Strength      AROM   Overall AROM Comments  Cervical flexion WNL's, extension decreased 75% with pain, rotaiton WNL's,, side bending decreased 25%      Strength   Overall Strength Comments  UE strength WNL's      Palpation   Palpation comment  patient is extremely tight with a lot of trigger points in the upper traps                  Objective measurements completed on examination: See above findings.      [facility] Adult PT Treatment/Exercise - 08/22/18 0001      Modalities   Modalities  Traction      Traction   Type of Traction  Cervical    Min (lbs)  10    Max (lbs)  12    Rest Time  static    Time  12             PT Education - 08/22/18 0917    Education Details  reviewed HEP given by MD gave her some cues to help with the form and do better    Person(s) Educated  Patient    Methods  Explanation;Demonstration;Handout    Comprehension  Verbalized understanding;Returned demonstration;Tactile cues required;Verbal cues required       PT Short Term Goals - 08/22/18 0920      PT SHORT TERM GOAL #1   Title  independent with initial HEP    Time  2    Period  Weeks    Status  New        PT Long Term Goals - 08/22/18 0920      PT LONG TERM GOAL #1   Title  increase cervical ROM to WNL's    Time  8    Period  Weeks    Status  New      PT LONG TERM GOAL #2   Title  understand posture and body mechanics    Time  8    Period  Weeks    Status  New      PT LONG TERM GOAL #3   Title  decrease pain 50%    Time  8    Period  Weeks    Status  New      PT LONG TERM GOAL #4   Title  decrease numbness 50%    Time  8    Period  Weeks    Status  New      PT LONG TERM GOAL #5   Title  return to the gym    Time  8    Period  Weeks  Status  New             Plan - 08/22/18 7628    Clinical Impression Statement  Patient reports starting to have hand numbness about 8 weeks ago.  She reports that recently neck and shoulder pain have become more prominent.  She has very limited cervical extension, good UE strength, she has severe trigger points in the upper traps    Stability/Clinical Decision Making  Stable/Uncomplicated    Rehab Potential  Good    PT Frequency  2x / week    PT Duration  8 weeks    PT Treatment/Interventions  ADLs/Self Care Home  Management;Cryotherapy;Iontophoresis 4mg /ml Dexamethasone;Electrical Stimulation;Moist Heat;Traction;Ultrasound;Therapeutic exercise;Therapeutic activities;Neuromuscular re-education;Patient/family education;Manual techniques    PT Next Visit Plan  start gym for stability, traction if she felt it helped, work on spasms, could try DN    Consulted and Agree with Plan of Care  Patient       Patient will benefit from skilled therapeutic intervention in order to improve the following deficits and impairments:  Increased muscle spasms, Improper body mechanics, Decreased range of motion, Decreased strength, Impaired flexibility, Postural dysfunction, Pain  Visit Diagnosis: Cramp and spasm - Plan: PT plan of care cert/re-cert  Cervicalgia - Plan: PT plan of care cert/re-cert  Radiculopathy, cervical region - Plan: PT plan of care cert/re-cert     Problem List Patient Active Problem List   Diagnosis Date Noted  . Neck pain 08/15/2018  . Nonallopathic lesion of cervical region 08/15/2018  . Nonallopathic lesion of thoracic region 08/15/2018  . Nonallopathic lesion of rib cage 08/15/2018  . Nonallopathic lesion of lumbosacral region 08/15/2018  . Nonallopathic lesion of sacral region 08/15/2018  . Partial hamstring tear 09/23/2015    Sumner Boast., PT 08/22/2018, 10:09 AM  [address] [place], [place], [address] Phone: [phone]   Fax:  [phone]  Name: ALVINA STROTHER MRN: [id number] Date of Birth: [birth year]

## 2018-08-27 ENCOUNTER — Ambulatory Visit: Payer: BC Managed Care – PPO | Admitting: Physical Therapy

## 2018-08-27 ENCOUNTER — Encounter: Payer: Self-pay | Admitting: Physical Therapy

## 2018-08-27 ENCOUNTER — Other Ambulatory Visit: Payer: Self-pay

## 2018-08-27 DIAGNOSIS — M5412 Radiculopathy, cervical region: Secondary | ICD-10-CM

## 2018-08-27 DIAGNOSIS — M542 Cervicalgia: Secondary | ICD-10-CM

## 2018-08-27 DIAGNOSIS — R252 Cramp and spasm: Secondary | ICD-10-CM | POA: Diagnosis not present

## 2018-08-27 NOTE — Therapy (Signed)
[place], [place], [address] Phone: [phone]   Fax:  [phone]  Physical Therapy Treatment  Patient Details  Name: Elizabeth [name] MRN: [id number] Date of Birth: [birth year] Referring Provider (PT): Creig Hines   Encounter Date: 08/27/2018  PT End of Session - 08/27/18 0853    Visit Number  2    Date for PT Re-Evaluation  10/22/18    PT Start Time  0800    PT Stop Time  0908    PT Time Calculation (min)  68 min    Activity Tolerance  Patient tolerated treatment well    Behavior During Therapy  [facility] for tasks assessed/performed       Past Medical History:  Diagnosis Date  . Breast mass 10/25/2016   Left breast mass x months larger than a pea , MRI of Breast 10/2017 - no problem  . Complication of anesthesia   . 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 Diverticulosis   . HOH (hard of hearing)    Bilateral  . Hypothyroidism   . PONV (postoperative nausea and vomiting)   . 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 Vaginal candidiasis 07/2015   after antibiotics    Past Surgical History:  Procedure Laterality Date  . AUGMENTATION MAMMAPLASTY Bilateral 2004  . COLONOSCOPY  2014  . HYSTEROTOMY     Robotic  . LAPAROTOMY  1982  . LID LESION EXCISION Left 12/22/2017   Procedure: LID LESION EXCISION with reconstruction of lower   left and right eye lids;  Surgeon: Clista Bernhardt, MD;  Location: [facility];  Service: Ophthalmology;  Laterality: Left;  Frozen sections  . MENISCUS REPAIR Left 1997  . TONSILLECTOMY  1966    There were no vitals filed for this visit.  Subjective Assessment - 08/27/18 0805    Subjective  Pt reports some numbness in her L hand. Discomfort in the neck last night    Currently in Pain?  No/denies    Pain Location  Neck    Pain Descriptors / Indicators  Discomfort                        [facility] Adult PT Treatment/Exercise - 08/27/18 0001      Exercises   Exercises  Neck;Shoulder      Neck Exercises: Machines for Strengthening   UBE (Upper Arm Bike)  L1 x3 min each way       Neck Exercises: Theraband   Rows  20 reps;Red    Shoulder External Rotation  Red;20 reps      Neck Exercises: Standing   Wall Push Ups  15 reps    Other Standing Exercises  Shruga with levater stretch 5 & 5 sec x2       Neck Exercises: Seated   Neck Retraction  10 reps;3 secs      Shoulder Exercises: Standing   Extension  Theraband;20 reps;Both      Modalities   Modalities  Traction;Electrical Stimulation;Moist Heat      Moist Heat Therapy   Number Minutes Moist Heat  15 Minutes    Moist Heat Location  Cervical      Electrical Stimulation   Electrical Stimulation Location  Cervical    Electrical Stimulation Action  IFC    Electrical Stimulation Parameters  supine to Pt tolerance    Electrical Stimulation  Goals  Pain      Traction   Type of Traction  Cervical    Min (lbs)  10    Max (lbs)  12    Rest Time  static    Time  12      Manual Therapy   Manual Therapy  --               PT Short Term Goals - 08/22/18 0920      PT SHORT TERM GOAL #1   Title  independent with initial HEP    Time  2    Period  Weeks    Status  New        PT Long Term Goals - 08/22/18 0920      PT LONG TERM GOAL #1   Title  increase cervical ROM to WNL's    Time  8    Period  Weeks    Status  New      PT LONG TERM GOAL #2   Title  understand posture and body mechanics    Time  8    Period  Weeks    Status  New      PT LONG TERM GOAL #3   Title  decrease pain 50%    Time  8    Period  Weeks    Status  New      PT LONG TERM GOAL #4   Title  decrease numbness 50%    Time  8    Period  Weeks    Status  New      PT LONG TERM GOAL #5   Title  return to the gym    Time  8    Period  Weeks    Status  New            Plan -  08/27/18 9509    Clinical Impression Statement  Pt tolerated an initial progression to TE well. She did report some tingling in her L palm with UBE aerobic warm up. Good form with all exercises, cues to maintain a neutral cervical spine. Tingling in R palm noted with wall push ups.     Stability/Clinical Decision Making  Stable/Uncomplicated    Rehab Potential  Good    PT Frequency  2x / week    PT Duration  8 weeks    PT Treatment/Interventions  ADLs/Self Care Home Management;Cryotherapy;Iontophoresis 4mg /ml Dexamethasone;Electrical Stimulation;Moist Heat;Traction;Ultrasound;Therapeutic exercise;Therapeutic activities;Neuromuscular re-education;Patient/family education;Manual techniques    PT Next Visit Plan  gym for stability, traction if she felt it helped, work on spasms, continue DN       Patient will benefit from skilled therapeutic intervention in order to improve the following deficits and impairments:  Increased muscle spasms, Improper body mechanics, Decreased range of motion, Decreased strength, Impaired flexibility, Postural dysfunction, Pain  Visit Diagnosis: Cramp and spasm  Radiculopathy, cervical region  Cervicalgia     Problem List Patient Active Problem List   Diagnosis Date Noted  . Neck pain 08/15/2018  . Nonallopathic lesion of cervical region 08/15/2018  . Nonallopathic lesion of thoracic region 08/15/2018  . Nonallopathic lesion of rib cage 08/15/2018  . Nonallopathic lesion of lumbosacral region 08/15/2018  . Nonallopathic lesion of sacral region 08/15/2018  . Partial hamstring tear 09/23/2015    Scot Jun 08/27/2018, 8:55 AM  [place] [address] [place], [place], [address] Phone: [phone]   Fax:  [phone]  Name: Elizabeth [name] MRN: [id number] Date of Birth: [birth year]

## 2018-08-30 ENCOUNTER — Encounter: Payer: Self-pay | Admitting: Physical Therapy

## 2018-08-30 ENCOUNTER — Ambulatory Visit: Payer: BC Managed Care – PPO | Admitting: Physical Therapy

## 2018-08-30 ENCOUNTER — Other Ambulatory Visit: Payer: Self-pay

## 2018-08-30 DIAGNOSIS — R252 Cramp and spasm: Secondary | ICD-10-CM | POA: Diagnosis not present

## 2018-08-30 DIAGNOSIS — M5412 Radiculopathy, cervical region: Secondary | ICD-10-CM

## 2018-08-30 NOTE — Therapy (Signed)
[place], [place], [address] Phone: [phone]   Fax:  [phone]  Physical Therapy Treatment  Patient Details  Name: SIMONA ROCQUE MRN: [id number] Date of Birth: 1960-12-24 Referring Provider (PT): Creig Hines   Encounter Date: 08/30/2018  PT End of Session - 08/30/18 0842    Visit Number  3    Date for PT Re-Evaluation  10/22/18    PT Start Time  0800    PT Stop Time  0881    PT Time Calculation (min)  57 min    Activity Tolerance  Patient tolerated treatment well;No increased pain    Behavior During Therapy  WFL for tasks assessed/performed       Past Medical History:  Diagnosis Date  . Breast mass 10/25/2016   Left breast mass x months larger than a pea , MRI of Breast 10/2017 - no problem  . Complication of anesthesia   . 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 Diverticulosis   . HOH (hard of hearing)    Bilateral  . Hypothyroidism   . PONV (postoperative nausea and vomiting)   . 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 Vaginal candidiasis 07/2015   after antibiotics    Past Surgical History:  Procedure Laterality Date  . AUGMENTATION MAMMAPLASTY Bilateral 2004  . COLONOSCOPY  2014  . HYSTEROTOMY     Robotic  . LAPAROTOMY  1982  . LID LESION EXCISION Left 12/22/2017   Procedure: LID LESION EXCISION with reconstruction of lower   left and right eye lids;  Surgeon: Clista Bernhardt, MD;  Location: [facility];  Service: Ophthalmology;  Laterality: Left;  Frozen sections  . MENISCUS REPAIR Left 1997  . TONSILLECTOMY  1966    There were no vitals filed for this visit.  Subjective Assessment - 08/30/18 0805    Subjective  Tightness on the L side of her neck, Numbness on the L pinky finger and palm of hand    Currently in Pain?  No/denies    Pain Score  0-No pain    Pain Location  Neck    Pain Descriptors /  Indicators  Tightness                       [facility] Adult PT Treatment/Exercise - 08/30/18 0001      Neck Exercises: Machines for Strengthening   UBE (Upper Arm Bike)  L1.5 x3 min each way       Neck Exercises: Standing   Wall Push Ups  15 reps    Other Standing Exercises  Shrugs with levater stretch 5 & 5 sec x2     Other Standing Exercises  Shoulder ER red 2x10       Neck Exercises: Seated   Shoulder Flexion  Both;20 reps    Shoulder Flexion Weights (lbs)  3      Shoulder Exercises: Standing   Other Standing Exercises  20lb biceps curls 2x10     Other Standing Exercises  25lb triceps ext 2x10       Shoulder Exercises: Power [employer]  20 reps    Row Limitations  20lb    Other Power Tower Exercises  Lat pulls 20lb 2x10       Manual Therapy   Manual Therapy  Soft tissue mobilization;Passive ROM;Manual Traction;Myofascial release    Soft tissue mobilization  occipital release, posterior cervical para spinalesupper traps and into the cervical parapsinals    Myofascial Release  passive release to levators.    Passive ROM  cervical spine    Manual Traction  cervical spine 5x20 sec               PT Short Term Goals - 08/30/18 0847      PT SHORT TERM GOAL #1   Title  independent with initial HEP    Status  Achieved        PT Long Term Goals - 08/22/18 0920      PT LONG TERM GOAL #1   Title  increase cervical ROM to WNL's    Time  8    Period  Weeks    Status  New      PT LONG TERM GOAL #2   Title  understand posture and body mechanics    Time  8    Period  Weeks    Status  New      PT LONG TERM GOAL #3   Title  decrease pain 50%    Time  8    Period  Weeks    Status  New      PT LONG TERM GOAL #4   Title  decrease numbness 50%    Time  8    Period  Weeks    Status  New      PT LONG TERM GOAL #5   Title  return to the gym    Time  8    Period  Weeks    Status  New            Plan - 08/30/18 2751    Clinical  Impression Statement  Pt reports some pain the day after last treatment, but then it got really better. Omitted DN today and elected to go with more STM. She does well with all exercises, the occasional of numbness in hands with some exercises. Positive response to MT.    Stability/Clinical Decision Making  Stable/Uncomplicated    Rehab Potential  Good    PT Frequency  2x / week    PT Duration  8 weeks    PT Treatment/Interventions  ADLs/Self Care Home Management;Cryotherapy;Iontophoresis 4mg /ml Dexamethasone;Electrical Stimulation;Moist Heat;Traction;Ultrasound;Therapeutic exercise;Therapeutic activities;Neuromuscular re-education;Patient/family education;Manual techniques    PT Next Visit Plan  Assess Tx mat try DN again       Patient will benefit from skilled therapeutic intervention in order to improve the following deficits and impairments:  Increased muscle spasms, Improper body mechanics, Decreased range of motion, Decreased strength, Impaired flexibility, Postural dysfunction, Pain  Visit Diagnosis: Cramp and spasm  Radiculopathy, cervical region     Problem List Patient Active Problem List   Diagnosis Date Noted  . Neck pain 08/15/2018  . Nonallopathic lesion of cervical region 08/15/2018  . Nonallopathic lesion of thoracic region 08/15/2018  . Nonallopathic lesion of rib cage 08/15/2018  . Nonallopathic lesion of lumbosacral region 08/15/2018  . Nonallopathic lesion of sacral region 08/15/2018  . Partial hamstring tear 09/23/2015    Scot Jun, PTA 08/30/2018, 8:47 AM  [place], [place], [address] Phone: [phone]   Fax:  [phone]  Name: SYLEENA MCHAN MRN: [id number] Date of Birth: 1961/01/13

## 2018-09-04 ENCOUNTER — Encounter: Payer: Self-pay | Admitting: Physical Therapy

## 2018-09-04 ENCOUNTER — Ambulatory Visit: Payer: BC Managed Care – PPO | Admitting: Physical Therapy

## 2018-09-04 ENCOUNTER — Other Ambulatory Visit: Payer: Self-pay

## 2018-09-04 DIAGNOSIS — R252 Cramp and spasm: Secondary | ICD-10-CM

## 2018-09-04 DIAGNOSIS — M5412 Radiculopathy, cervical region: Secondary | ICD-10-CM

## 2018-09-04 DIAGNOSIS — M542 Cervicalgia: Secondary | ICD-10-CM

## 2018-09-04 NOTE — Progress Notes (Signed)
[name] [place], [place] [address] Phone: [phone] Subjective:   [name], am serving as a scribe for Dr. Hulan Saas.   CC: Neck pain follow-up  HLK:[id number]   08/15/2018: Neck pain.  Seems to be muscular but there is some mild midline tenderness.  X-rays ordered today.  We discussed that with no radicular symptoms that I do not feel advanced imaging will be warranted.  We discussed anti-inflammatories, icing regimen, home exercises, we discussed ergonomics.  In addition to this we discussed gabapentin which was sent in today.  Do not believe this is peripheral neuropathy.  Patient responded fairly well to osteopathic manipulation.  Follow-up again 4 to 6 weeks  09/05/2018: Elizabeth [name] is a 58 y.o. female coming in with complaint of neck pain. Woke up this morning and had right hand numbness for 45 minutes. Does have numbness in left hand with cell phone use. Also mentions holding onto her granddaughter and her arm from elbow to hand went numb. Has discontinued gabapentin. Does use Mobic. Has done one physical therapy session. Did have temporary relief.    Neck x-rays were independently visualized by me showing mild degenerative changes.  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   Diverticulosis    HOH (hard of hearing)    Bilateral   Hypothyroidism    PONV (postoperative nausea and vomiting)    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   Vaginal candidiasis 07/2015   after antibiotics   Past Surgical History:  Procedure Laterality Date   AUGMENTATION MAMMAPLASTY Bilateral 2004   COLONOSCOPY  2014   HYSTEROTOMY     Robotic   LAPAROTOMY  1982   LID LESION EXCISION Left  12/22/2017   Procedure: LID LESION EXCISION with reconstruction of lower   left and right eye lids;  Surgeon: Clista Bernhardt, MD;  Location: [facility];  Service: Ophthalmology;  Laterality: Left;  Frozen sections   MENISCUS REPAIR Left 1997   TONSILLECTOMY  1966   Social History   Socioeconomic History   Marital status: Married    Spouse name: Not on file   Number of children: 3   Years of education: Not on file   Highest education level: Not on file  Occupational History   Occupation: Therapist, sports  Social Needs   Financial resource strain: Not on file   Food insecurity    Worry: Not on file    Inability: Not on file   Transportation needs    Medical: Not on file    Non-medical: Not on file  Tobacco Use   Smoking status: Never Smoker   Smokeless tobacco: Never Used  Substance and Sexual Activity   Alcohol use: Yes    Alcohol/week: 1.0 standard drinks    Types: 1 Glasses of wine per week   Drug use: No   Sexual activity: Not on file  Lifestyle   Physical activity    Days per week: Not on file    Minutes per session: Not on file   Stress: Not on file  Relationships   Social connections    Talks on phone: Not on file    Gets together: Not on file  Attends religious service: Not on file    Active member of club or organization: Not on file    Attends meetings of clubs or organizations: Not on file    Relationship status: Not on file  Other Topics Concern   Not on file  Social History Narrative   Married, 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   2 sons 1 daughter all adults   07/23/2015   Allergies  Allergen Reactions   Bee Venom Anaphylaxis   Penicillins Rash and Other (See Comments)    PATIENT HAS HAD A PCN REACTION WITH IMMEDIATE RASH, FACIAL/TONGUE/THROAT SWELLING, SOB, OR LIGHTHEADEDNESS WITH HYPOTENSION:  #  #  YES  #  #  Has patient had a PCN reaction causing severe rash involving mucus membranes or skin necrosis: no Has patient had a PCN reaction that  required hospitalization: no Has patient had a PCN reaction occurring within the last 10 years: no If all of the above answers are "NO", then may proceed with Cephalosporin use.    Codeine Nausea And Vomiting   Oxycodone Nausea And Vomiting   Family History  Problem Relation Age of Onset   Arthritis Mother    Hypertension Father    Diabetes Father    Heart disease Father    Breast cancer Sister     Current Outpatient Medications (Endocrine & Metabolic):    insulin aspart (NOVOLOG FLEXPEN) 100 UNIT/ML FlexPen, Inject 2 Units into the skin daily with supper.   insulin degludec (TRESIBA FLEXTOUCH) 100 UNIT/ML SOPN FlexTouch Pen, Inject 10 Units into the skin every evening.    metFORMIN (GLUCOPHAGE) 500 MG tablet, Take 500 mg by mouth 2 (two) times daily.    PROGESTERONE MICRONIZED PO, Take 175 mg by mouth daily. **Compounded med   thyroid (NATURE-THROID) 65 MG tablet, Take 65 mg by mouth daily. 1 grain = 64.8 mg   Current Outpatient Medications (Respiratory):    promethazine (PHENERGAN) 6.25 MG/5ML syrup, Take 20 mLs (25 mg total) by mouth at bedtime for 2 days.  Current Outpatient Medications (Analgesics):    meloxicam (MOBIC) 15 MG tablet, Take 1 tablet (15 mg total) by mouth daily.   Current Outpatient Medications (Other):    Multiple Vitamins-Minerals (MULTIVITAMIN WITH MINERALS) tablet, Take 1 tablet by mouth daily.    Past medical history, social, surgical and family history all reviewed in electronic medical record.  No pertanent information unless stated regarding to the chief complaint.   Review of Systems:  No headache, visual changes, nausea, vomiting, diarrhea, constipation, dizziness, abdominal pain, skin rash, fevers, chills, night sweats, weight loss, swollen lymph nodes, body aches, joint swelling, chest pain, shortness of breath, mood changes.  Positive muscle aches  Objective  Blood pressure 128/70, pulse 75, height 5\' 2"  (1.575 m), last  menstrual period 08/03/2010, SpO2 99 %.    General: No apparent distress alert and oriented x3 mood and affect normal, dressed appropriately.  HEENT: Pupils equal, extraocular movements intact  Respiratory: Patient's speak in full sentences and does not appear short of breath  Cardiovascular: No lower extremity edema, non tender, no erythema  Skin: Warm dry intact with no signs of infection or rash on extremities or on axial skeleton.  Abdomen: Soft nontender  Neuro: Cranial nerves II through XII are intact, neurovascularly intact in all extremities with 2+ DTRs and 2+ pulses.  Lymph: No lymphadenopathy of posterior or anterior cervical chain or axillae bilaterally.  Gait normal with good balance and coordination.  MSK:  Non tender with  full range of motion and good stability and symmetric strength and tone of shoulders, elbows, wrist, hip, knee and ankles bilaterally.  Neck: Inspection mild loss of lordosis. No palpable stepoffs. Negative Spurling's maneuver. Full neck range of motion Grip strength and sensation normal in bilateral hands Strength good C4 to T1 distribution No sensory change to C4 to T1 Negative Hoffman sign bilaterally Tightness of the trapezius bilaterally   Osteopathic findings C2 flexed rotated and side bent right C4 flexed rotated and side bent left C7 flexed rotated and side bent left T3 extended rotated and side bent right inhaled third rib T9 extended rotated and side bent left L2 flexed rotated and side bent right Sacrum right on right    Impression and Recommendations:     This case required medical decision making of moderate complexity. The above documentation has been reviewed and is accurate and complete Lyndal Pulley, DO       Note: This dictation was prepared with Dragon dictation along with smaller phrase technology. Any transcriptional errors that result from this process are unintentional.

## 2018-09-04 NOTE — Therapy (Signed)
[facility] [address] [place], [place], [address] Phone: [phone]   Fax:  [phone]  Physical Therapy Treatment  Patient Details  Name: Elizabeth [name] MRN: [id number] Date of Birth: 12/01/1960 Referring Provider (PT): Z. Smith   Encounter Date: 09/04/2018  PT End of Session - 09/04/18 0847    Visit Number  4    Date for PT Re-Evaluation  10/22/18    PT Start Time  0800    PT Stop Time  0902    PT Time Calculation (min)  62 min    Activity Tolerance  Patient tolerated treatment well;No increased pain    Behavior During Therapy  WFL for tasks assessed/performed       Past Medical History:  Diagnosis Date  . Breast mass 10/25/2016   Left breast mass x months larger than a pea , MRI of Breast 10/2017 - no problem  . Complication of anesthesia   . 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 Diverticulosis   . HOH (hard of hearing)    Bilateral  . Hypothyroidism   . PONV (postoperative nausea and vomiting)   . 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 Vaginal candidiasis 07/2015   after antibiotics    Past Surgical History:  Procedure Laterality Date  . AUGMENTATION MAMMAPLASTY Bilateral 2004  . COLONOSCOPY  2014  . HYSTEROTOMY     Robotic  . LAPAROTOMY  1982  . LID LESION EXCISION Left 12/22/2017   Procedure: LID LESION EXCISION with reconstruction of lower   left and right eye lids;  Surgeon: Abugo, Usiwoma Ene, MD;  [facility];  Service: Ophthalmology;  Laterality: Left;  Frozen sections  . MENISCUS REPAIR Left 1997  . TONSILLECTOMY  1966    There were no vitals filed for this visit.  Subjective Assessment - 09/04/18 0800    Subjective  Pt reports that she is not as sore as she was last week. She does report waking up with numbness from elbow to hand on the R side. PT stated that her hands goes go numb when talking on the phone  Making her have to switch hands.    Currently in Pain?  No/denies                       [facility] Adult PT Treatment/Exercise - 09/04/18 0001      Neck Exercises: Machines for Strengthening   UBE (Upper Arm Bike)  L3 x3 min each way     Other Machines for Strengthening  Rows & lats 25lb 2x15      Neck Exercises: Standing   Wall Push Ups  20 reps    Upper Extremity D2  Flexion;Weights;20 reps    UE D2 Weights (lbs)  2, 3     Other Standing Exercises  Shoulder ER red 2x15      Shoulder Exercises: Standing   Flexion  Strengthening;Both;20 reps;Weights    Shoulder Flexion Weight (lbs)  3    ABduction  Weights;20 reps;Both    Shoulder ABduction Weight (lbs)  2      Moist Heat Therapy   Number Minutes Moist Heat  15 Minutes    Moist Heat Location  Cervical      Electrical Stimulation   Electrical Stimulation Location  Cervical    Electrical Stimulation Action  IFC    Electrical Stimulation Parameters  supint   to pt tolerance    Electrical Stimulation Goals  Pain      Manual Therapy   Manual Therapy  Soft tissue mobilization;Passive ROM;Manual Traction;Myofascial release    Soft tissue mobilization  occipital release, posterior cervical para spinalesupper traps and into the cervical parapsinals    Myofascial Release  passive release to levators.    Passive ROM  cervical spine    Manual Traction  cervical spine 5x20 sec               PT Short Term Goals - 08/30/18 0847      PT SHORT TERM GOAL #1   Title  independent with initial HEP    Status  Achieved        PT Long Term Goals - 09/04/18 0849      PT LONG TERM GOAL #1   Title  increase cervical ROM to WNL's    Status  Partially Met      PT LONG TERM GOAL #2   Title  understand posture and body mechanics    Status  Achieved            Plan - 09/04/18 0847    Clinical Impression Statement  Pt performed all exercises interventions well. Shoulder did fatigue with D2 flexion. No numbness and  tingling reported with the exercises. She did however have some numbness in the R hand with STM to the lateral R neck. Overall positive response to MT, Cervical PROM is WNL.    Stability/Clinical Decision Making  Stable/Uncomplicated    PT Frequency  2x / week    PT Duration  8 weeks    PT Treatment/Interventions  ADLs/Self Care Home Management;Cryotherapy;Iontophoresis 52m/ml Dexamethasone;Electrical Stimulation;Moist Heat;Traction;Ultrasound;Therapeutic exercise;Therapeutic activities;Neuromuscular re-education;Patient/family education;Manual techniques    PT Next Visit Plan  Assess Tx mat try DN again, posterior strengthening.       Patient will benefit from skilled therapeutic intervention in order to improve the following deficits and impairments:  Increased muscle spasms, Improper body mechanics, Decreased range of motion, Decreased strength, Impaired flexibility, Postural dysfunction, Pain  Visit Diagnosis: 1. Cramp and spasm   2. Radiculopathy, cervical region   3. Cervicalgia        Problem List Patient Active Problem List   Diagnosis Date Noted  . Neck pain 08/15/2018  . Nonallopathic lesion of cervical region 08/15/2018  . Nonallopathic lesion of thoracic region 08/15/2018  . Nonallopathic lesion of rib cage 08/15/2018  . Nonallopathic lesion of lumbosacral region 08/15/2018  . Nonallopathic lesion of sacral region 08/15/2018  . Partial hamstring tear 09/23/2015    RScot Jun PTA 09/04/2018, 8:49 AM  [place] [address]Phone: [phone]  Fax:  [phone] Name: Elizabeth THACKSTONMRN: [id number] of Birth: 129-May-1962

## 2018-09-05 ENCOUNTER — Other Ambulatory Visit (INDEPENDENT_AMBULATORY_CARE_PROVIDER_SITE_OTHER): Payer: BC Managed Care – PPO

## 2018-09-05 ENCOUNTER — Encounter: Payer: Self-pay | Admitting: Family Medicine

## 2018-09-05 ENCOUNTER — Ambulatory Visit (INDEPENDENT_AMBULATORY_CARE_PROVIDER_SITE_OTHER): Payer: BC Managed Care – PPO | Admitting: Family Medicine

## 2018-09-05 VITALS — BP 128/70 | HR 75 | Ht 62.0 in

## 2018-09-05 DIAGNOSIS — M542 Cervicalgia: Secondary | ICD-10-CM | POA: Diagnosis not present

## 2018-09-05 DIAGNOSIS — M2550 Pain in unspecified joint: Secondary | ICD-10-CM | POA: Diagnosis not present

## 2018-09-05 DIAGNOSIS — M999 Biomechanical lesion, unspecified: Secondary | ICD-10-CM

## 2018-09-05 LAB — FERRITIN: Ferritin: 18.9 ng/mL (ref 10.0–291.0)

## 2018-09-05 LAB — VITAMIN B12: Vitamin B-12: 515 pg/mL (ref 211–911)

## 2018-09-05 LAB — TSH: TSH: 1.48 u[IU]/mL (ref 0.35–4.50)

## 2018-09-05 LAB — URIC ACID: Uric Acid, Serum: 4.4 mg/dL (ref 2.4–7.0)

## 2018-09-05 LAB — VITAMIN D 25 HYDROXY (VIT D DEFICIENCY, FRACTURES): VITD: 49.85 ng/mL (ref 30.00–100.00)

## 2018-09-05 LAB — T4, FREE: Free T4: 0.76 ng/dL (ref 0.60–1.60)

## 2018-09-05 LAB — T3, FREE: T3, Free: 6.3 pg/mL — ABNORMAL HIGH (ref 2.3–4.2)

## 2018-09-05 LAB — SEDIMENTATION RATE: Sed Rate: 8 mm/hr (ref 0–30)

## 2018-09-05 NOTE — Patient Instructions (Signed)
Good to see you  Tried manipulation again  Keep working with PT  Try the lower dose of the thyroid.  We will get labs as well  See me agai nin 6 weeks Congrats on the wedding!

## 2018-09-06 ENCOUNTER — Encounter: Payer: Self-pay | Admitting: Family Medicine

## 2018-09-06 LAB — PTH, INTACT AND CALCIUM
Calcium: 9.5 mg/dL (ref 8.6–10.4)
PTH: 41 pg/mL (ref 14–64)

## 2018-09-06 LAB — CALCIUM, IONIZED: Calcium, Ion: 5.21 mg/dL (ref 4.8–5.6)

## 2018-09-06 NOTE — Assessment & Plan Note (Signed)
Decision today to treat with OMT was based on Physical Exam  After verbal consent patient was treated with HVLA, ME, FPR techniques in cervical, thoracic, rib lumbar and sacral areas  Patient tolerated the procedure well with improvement in symptoms  Patient given exercises, stretches and lifestyle modifications  See medications in patient instructions if given  Patient will follow up in 4-6 weeks 

## 2018-09-06 NOTE — Assessment & Plan Note (Signed)
I believe the patient's neck pain is multifactorial.  Discussed with patient in great length about icing regimen and home exercises.  I believe there could be some underlying autoimmune disease with patient having the diabetes as well as the Hashimoto's disease.  We discussed also in the last 2 months patient did change her thyroid medication may be contributing.  Patient is also having significant stress.  Patient will try to address those issues.  Laboratory work-up ordered as well to see if this will help with some of the tingling.  Follow-up again in 4 to 6 weeks

## 2018-09-07 ENCOUNTER — Other Ambulatory Visit: Payer: Self-pay

## 2018-09-07 ENCOUNTER — Encounter: Payer: Self-pay | Admitting: Physical Therapy

## 2018-09-07 ENCOUNTER — Ambulatory Visit: Payer: BC Managed Care – PPO | Admitting: Physical Therapy

## 2018-09-07 DIAGNOSIS — M542 Cervicalgia: Secondary | ICD-10-CM

## 2018-09-07 DIAGNOSIS — M5412 Radiculopathy, cervical region: Secondary | ICD-10-CM

## 2018-09-07 DIAGNOSIS — R252 Cramp and spasm: Secondary | ICD-10-CM | POA: Diagnosis not present

## 2018-09-07 NOTE — Therapy (Signed)
[facility] [place], [place], [address] Phone: [phone]   Fax:  [phone]  Physical Therapy Treatment  Patient Details  Name: Elizabeth [name] MRN: [id number] Date of Birth: 1961/03/07 Referring Provider (PT): Creig Hines   Encounter Date: 09/07/2018  PT End of Session - 09/07/18 0845    Visit Number  5    Date for PT Re-Evaluation  10/22/18    PT Start Time  0800    PT Stop Time  0100    PT Time Calculation (min)  57 min    Activity Tolerance  Patient tolerated treatment well;No increased pain    Behavior During Therapy  WFL for tasks assessed/performed       Past Medical History:  Diagnosis Date  . Breast mass 10/25/2016   Left breast mass x months larger than a pea , MRI of Breast 10/2017 - no problem  . Complication of anesthesia   . 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 Diverticulosis   . HOH (hard of hearing)    Bilateral  . Hypothyroidism   . PONV (postoperative nausea and vomiting)   . 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 Vaginal candidiasis 07/2015   after antibiotics    Past Surgical History:  Procedure Laterality Date  . AUGMENTATION MAMMAPLASTY Bilateral 2004  . COLONOSCOPY  2014  . HYSTEROTOMY     Robotic  . LAPAROTOMY  1982  . LID LESION EXCISION Left 12/22/2017   Procedure: LID LESION EXCISION with reconstruction of lower   left and right eye lids;  Surgeon: Clista Bernhardt, MD;  Location: [facility];  Service: Ophthalmology;  Laterality: Left;  Frozen sections  . MENISCUS REPAIR Left 1997  . TONSILLECTOMY  1966    There were no vitals filed for this visit.  Subjective Assessment - 09/07/18 0801    Subjective  "Still some tingling in right and L palm"    Currently in Pain?  No/denies    Pain Score  0-No pain         [facility] PT Assessment - 09/07/18 0001      AROM   Overall AROM Comments  Cervical  ROM WFL some pulling with R rotation       Strength   Overall Strength Comments  UE strength WNL's                   [facility] Adult PT Treatment/Exercise - 09/07/18 0001      Neck Exercises: Machines for Strengthening   UBE (Upper Arm Bike)  L3 x3 min each way     Other Machines for Strengthening  Rows & lats 25lb 3x10      Neck Exercises: Standing   Wall Push Ups  20 reps    Upper Extremity D2  Flexion;Weights;20 reps    UE D2 Weights (lbs)  3    Other Standing Exercises  Shoulder ER green 2x10      Neck Exercises: Seated   Shoulder Flexion  Both;20 reps   abd combo    Shoulder Flexion Weights (lbs)  3      Moist Heat Therapy   Number Minutes Moist Heat  15 Minutes    Moist Heat Location  Cervical      Electrical Stimulation   Electrical Stimulation Location  Cervical    Electrical Stimulation Action  IFC    Electrical Stimulation Parameters  supine to pt tolerance    Electrical Stimulation Goals  Pain      Manual Therapy   Manual Therapy  Soft tissue mobilization;Passive ROM;Manual Traction;Myofascial release    Soft tissue mobilization  occipital release, posterior cervical para spinalesupper traps and into the cervical parapsinals    Myofascial Release  passive release to levators.    Passive ROM  cervical spine    Manual Traction  cervical spine 5x20 sec               PT Short Term Goals - 08/30/18 0847      PT SHORT TERM GOAL #1   Title  independent with initial HEP    Status  Achieved        PT Long Term Goals - 09/07/18 0847      PT LONG TERM GOAL #1   Title  increase cervical ROM to WNL's    Status  Achieved      PT LONG TERM GOAL #2   Title  understand posture and body mechanics    Status  Achieved      PT LONG TERM GOAL #3   Title  decrease pain 50%    Status  Partially Met      PT LONG TERM GOAL #4   Title  decrease numbness 50%    Status  On-going      PT LONG TERM GOAL #5   Title  return to the gym    Status   On-going   Covid - 19           Plan - 09/07/18 0846    Clinical Impression Statement  Pt is progressing towards goals. Good strength and ROM with all exercises. She has progressed increasing her cervical ROM. Green tband with ER was taxing on pt. Trigger point noted in upper R trap with MT.    Stability/Clinical Decision Making  Stable/Uncomplicated    Rehab Potential  Good    PT Frequency  2x / week    PT Treatment/Interventions  ADLs/Self Care Home Management;Cryotherapy;Iontophoresis 1m/ml Dexamethasone;Electrical Stimulation;Moist Heat;Traction;Ultrasound;Therapeutic exercise;Therapeutic activities;Neuromuscular re-education;Patient/family education;Manual techniques    PT Next Visit Plan  Assess  DN again, posterior chain strengthening.       Patient will benefit from skilled therapeutic intervention in order to improve the following deficits and impairments:  Increased muscle spasms, Improper body mechanics, Decreased range of motion, Decreased strength, Impaired flexibility, Postural dysfunction, Pain  Visit Diagnosis: 1. Cramp and spasm   2. Radiculopathy, cervical region   3. Cervicalgia        Problem List Patient Active Problem List   Diagnosis Date Noted  . Neck pain 08/15/2018  . Nonallopathic lesion of cervical region 08/15/2018  . Nonallopathic lesion of thoracic region 08/15/2018  . Nonallopathic lesion of rib cage 08/15/2018  . Nonallopathic lesion of lumbosacral region 08/15/2018  . Nonallopathic lesion of sacral region 08/15/2018  . Partial hamstring tear 09/23/2015    RScot Jun PTA 09/07/2018, 8:49 AM  [place] [address]Phone: [phone]  Fax:  [phone] Name: Elizabeth NOLDMRN: [id number] of Birth: 1Jun 27, 1962

## 2018-09-25 ENCOUNTER — Encounter: Payer: Self-pay | Admitting: Family Medicine

## 2018-09-26 ENCOUNTER — Other Ambulatory Visit: Payer: Self-pay

## 2018-09-26 DIAGNOSIS — M5412 Radiculopathy, cervical region: Secondary | ICD-10-CM

## 2018-10-17 ENCOUNTER — Ambulatory Visit: Payer: BC Managed Care – PPO | Admitting: Family Medicine

## 2018-10-21 ENCOUNTER — Other Ambulatory Visit: Payer: BC Managed Care – PPO

## 2018-10-29 ENCOUNTER — Other Ambulatory Visit: Payer: Self-pay

## 2018-10-29 ENCOUNTER — Ambulatory Visit
Admission: RE | Admit: 2018-10-29 | Discharge: 2018-10-29 | Disposition: A | Discharge status: Home / Self Care | Payer: BC Managed Care – PPO | Source: Ambulatory Visit | Attending: Family Medicine | Admitting: Family Medicine

## 2018-10-29 DIAGNOSIS — M5412 Radiculopathy, cervical region: Secondary | ICD-10-CM

## 2018-10-31 ENCOUNTER — Encounter: Payer: Self-pay | Admitting: Family Medicine

## 2018-11-05 NOTE — Progress Notes (Signed)
[facility] [place],  [address] Phone: [phone] Subjective:    I'm seeing this patient by the request  of:    CC: Neck pain follow-up  [id number]  Elizabeth [name] is a 58 y.o. female coming in with complaint of neck pain. States she has slightly improved. Ocasinally her lips tingle.  Patient states he has been doing the home exercises occasionally.  Felt like the physical therapy made things worse and is wondering advanced imaging.  Patient continues to have some difficulty.   Patient did have MRI of the neck done.  This was independently visualized by me showing degenerative disc disease from C4-C7 and patient does have foraminal narrowing from C4-C7 on the right side especially at C4-C5 with mild spinal stenosis at C5-C6 appears to have a potential osteophyte causing may be some mild irregularity of the spinal cord  Past Medical History:  Diagnosis Date  . Breast mass 10/25/2016   Left breast mass x months larger than a pea , MRI of Breast 10/2017 - no problem  . Complication of anesthesia   . 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 Diverticulosis   . HOH (hard of hearing)    Bilateral  . Hypothyroidism   . PONV (postoperative nausea and vomiting)   . 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 Vaginal candidiasis 07/2015   after antibiotics   Past Surgical History:  Procedure Laterality Date  . AUGMENTATION MAMMAPLASTY Bilateral 2004  . COLONOSCOPY  2014  . HYSTEROTOMY     Robotic  . LAPAROTOMY  1982  . LID LESION EXCISION Left 12/22/2017   Procedure: LID LESION EXCISION with reconstruction of lower   left and right eye lids;  Surgeon: Clista Bernhardt, MD;  Location: [facility];  Service: Ophthalmology;  Laterality: Left;  Frozen sections  . MENISCUS REPAIR Left 1997  . TONSILLECTOMY  1966   Social History   Socioeconomic History  . Marital status: Married     Spouse name: Not on file  . Number of children: 3  . Years of education: Not on file  . Highest education level: Not on file  Occupational History  . Occupation: Therapist, sports  Social Needs  . Financial resource strain: Not on file  . Food insecurity    Worry: Not on file    Inability: Not on file  . Transportation needs    Medical: Not on file    Non-medical: Not on file  Tobacco Use  . Smoking status: Never Smoker  . Smokeless tobacco: Never Used  Substance and Sexual Activity  . Alcohol use: Yes    Alcohol/week: 1.0 standard drinks    Types: 1 Glasses of wine per week  . Drug use: No  . Sexual activity: Not on file  Lifestyle  . Physical activity    Days per week: Not on file    Minutes per session: Not on file  . Stress: Not on file  Relationships  . Social Herbalist on phone: Not on file    Gets together: Not on file    Attends religious service: Not on file    Active member of club or organization: Not on file    Attends meetings of clubs or organizations: Not on file    Relationship status: Not on file  Other Topics Concern  . 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 Bee Venom Anaphylaxis  . Penicillins Rash and Other (See Comments)    PATIENT HAS HAD A PCN REACTION WITH IMMEDIATE RASH, FACIAL/TONGUE/THROAT SWELLING, SOB, OR LIGHTHEADEDNESS WITH HYPOTENSION:  #  #  YES  #  #  Has patient had a PCN reaction causing severe rash involving mucus membranes or skin necrosis: no Has patient had a PCN reaction that required hospitalization: no Has patient had a PCN reaction occurring within the last 10 years: no If all of the above answers are "NO", then may proceed with Cephalosporin use.   . Codeine Nausea And Vomiting  . Oxycodone Nausea And Vomiting   Family History  Problem Relation Age of Onset  . Arthritis Mother   . Hypertension  Father   . Diabetes Father   . Heart disease Father   . Breast cancer Sister     Current Outpatient Medications (Endocrine & Metabolic):  .  insulin aspart (NOVOLOG FLEXPEN) 100 UNIT/ML FlexPen, Inject 2 Units into the skin daily with supper. .  insulin degludec (TRESIBA FLEXTOUCH) 100 UNIT/ML SOPN FlexTouch Pen, Inject 10 Units into the skin every evening.  .  metFORMIN (GLUCOPHAGE) 500 MG tablet, Take 500 mg by mouth 2 (two) times daily.  Marland Kitchen  PROGESTERONE MICRONIZED PO, Take 175 mg by mouth daily. **Compounded med .  thyroid (NATURE-THROID) 65 MG tablet, Take 65 mg by mouth daily. 1 grain = 64.8 mg   Current Outpatient Medications (Respiratory):  .  promethazine (PHENERGAN) 6.25 MG/5ML syrup, Take 20 mLs (25 mg total) by mouth at bedtime for 2 days.  Current Outpatient Medications (Analgesics):  .  meloxicam (MOBIC) 15 MG tablet, Take 1 tablet (15 mg total) by mouth daily.   Current Outpatient Medications (Other):  Marland Kitchen  Multiple Vitamins-Minerals (MULTIVITAMIN WITH MINERALS) tablet, Take 1 tablet by mouth daily.    Past medical history, social, surgical and family history all reviewed in electronic medical record.  No pertanent information unless stated regarding to the chief complaint.   Review of Systems:  No headache, visual changes, nausea, vomiting, diarrhea, constipation, dizziness, abdominal pain, skin rash, fevers, chills, night sweats, weight loss, swollen lymph nodes, body aches, joint swelling,  chest pain, shortness of breath, mood changes.  Mild positive muscle aches  Objective  Blood pressure 140/76, pulse 68, height 5\' 2"  (1.575 m), weight 118 lb (53.5 kg), last menstrual period 08/03/2010, SpO2 98 %.    General: No apparent distress alert and oriented x3 mood and affect normal, dressed appropriately.  HEENT: Pupils equal, extraocular movements intact  Respiratory: Patient's speak in full sentences and does not appear short of breath  Cardiovascular: No lower  extremity edema, non tender, no erythema  Skin: Warm dry intact with no signs of infection or rash on extremities or on axial skeleton.  Abdomen: Soft nontender  Neuro: Cranial nerves II through XII are intact, neurovascularly intact in all extremities with 2+ DTRs and 2+ pulses.  Lymph: No lymphadenopathy of posterior or anterior cervical chain or axillae bilaterally.  Gait normal with good balance and coordination.  MSK:  Non tender with full range of motion and good stability and symmetric strength and tone of shoulders, elbows, wrist, hip, knee and ankles bilaterally.  Neck: Inspection loss of lordosis. No palpable stepoffs. Negative Spurling's maneuver. Mild limited range of motion rotation Grip strength and sensation normal in bilateral  hands Strength good C4 to T1 distribution No sensory change to C4 to T1 Negative Hoffman sign bilaterally Reflexes normal  Osteopathic findings C2 flexed rotated and side bent right C6 flexed rotated and side bent left T3 extended rotated and side bent right inhaled third rib T7 extended rotated and side bent left L2 flexed rotated and side bent right Sacrum right on right  Impression and Recommendations:     This case required medical decision making of moderate complexity. The above documentation has been reviewed and is accurate and complete Lyndal Pulley, DO       Note: This dictation was prepared with Dragon dictation along with smaller phrase technology. Any transcriptional errors that result from this process are unintentional.

## 2018-11-06 ENCOUNTER — Ambulatory Visit (INDEPENDENT_AMBULATORY_CARE_PROVIDER_SITE_OTHER): Payer: BC Managed Care – PPO | Admitting: Family Medicine

## 2018-11-06 ENCOUNTER — Encounter: Payer: Self-pay | Admitting: Family Medicine

## 2018-11-06 ENCOUNTER — Other Ambulatory Visit: Payer: Self-pay

## 2018-11-06 VITALS — BP 140/76 | HR 68 | Ht 62.0 in | Wt 118.0 lb

## 2018-11-06 DIAGNOSIS — M999 Biomechanical lesion, unspecified: Secondary | ICD-10-CM | POA: Diagnosis not present

## 2018-11-06 DIAGNOSIS — M542 Cervicalgia: Secondary | ICD-10-CM

## 2018-11-06 NOTE — Assessment & Plan Note (Signed)
Patient has known degenerative disc disease and does have areas of mild to moderate spinal stenosis in the cervical spine.  Discussed the possibility of an epidural which patient declined at the moment.  Responded fairly well to osteopathic manipulation.  Patient has discontinued physical therapy and hopefully will continue to do well.  Patient will follow-up with me again 6 weeks.

## 2018-11-06 NOTE — Assessment & Plan Note (Signed)
Decision today to treat with OMT was based on Physical Exam  After verbal consent patient was treated with HVLA, ME, FPR techniques in cervical, thoracic, rib, lumbar and sacral areas  Patient tolerated the procedure well with improvement in symptoms  Patient given exercises, stretches and lifestyle modifications  See medications in patient instructions if given  Patient will follow up in 6 weeks

## 2018-11-06 NOTE — Patient Instructions (Addendum)
Good to see you Follow up in 5-6 weeks

## 2018-11-07 ENCOUNTER — Encounter: Payer: Self-pay | Admitting: Family Medicine

## 2018-12-11 ENCOUNTER — Ambulatory Visit: Payer: BC Managed Care – PPO | Admitting: Family Medicine

## 2019-01-07 ENCOUNTER — Ambulatory Visit: Payer: BC Managed Care – PPO | Admitting: Family Medicine

## 2019-06-17 ENCOUNTER — Encounter: Payer: Self-pay | Admitting: Family Medicine

## 2019-06-19 ENCOUNTER — Encounter: Payer: Self-pay | Admitting: Family Medicine

## 2019-06-19 ENCOUNTER — Ambulatory Visit (INDEPENDENT_AMBULATORY_CARE_PROVIDER_SITE_OTHER): Payer: BC Managed Care – PPO | Admitting: Family Medicine

## 2019-06-19 ENCOUNTER — Other Ambulatory Visit: Payer: Self-pay

## 2019-06-19 ENCOUNTER — Ambulatory Visit (INDEPENDENT_AMBULATORY_CARE_PROVIDER_SITE_OTHER): Payer: BC Managed Care – PPO

## 2019-06-19 VITALS — BP 130/86 | HR 70 | Ht 62.0 in | Wt 124.0 lb

## 2019-06-19 DIAGNOSIS — M79604 Pain in right leg: Secondary | ICD-10-CM

## 2019-06-19 DIAGNOSIS — S76311D Strain of muscle, fascia and tendon of the posterior muscle group at thigh level, right thigh, subsequent encounter: Secondary | ICD-10-CM | POA: Diagnosis not present

## 2019-06-19 DIAGNOSIS — M999 Biomechanical lesion, unspecified: Secondary | ICD-10-CM

## 2019-06-19 MED ORDER — PREDNISONE 50 MG PO TABS: ORAL_TABLET | ORAL | 0 refills | Status: DC

## 2019-06-19 NOTE — Assessment & Plan Note (Signed)
Recurrent with exacerbation.  Discussed compression, icing regimen, home exercise.  Patient wants to avoid significant amount of oral anti-inflammatories but does take meloxicam.  Gabapentin we have discussed previously but has had side effects.  Patient prednisone but will monitor her blood sugars on a more regular basis.  Patient is very good at checking go very well.  Patient will follow up with me again in 4 to 8 weeks to see how patient does.

## 2019-06-19 NOTE — Assessment & Plan Note (Signed)

## 2019-06-19 NOTE — Progress Notes (Signed)
[facility] [place] Phone: [phone] Subjective:   [name], am serving as a scribe for Dr. Hulan Saas. This visit occurred during the SARS-CoV-2 public health emergency.  Safety protocols were in place, including screening questions prior to the visit, additional usage of staff PPE, and extensive cleaning of exam room while observing appropriate contact time as indicated for disinfecting solutions.   I'm seeing this patient by the request  of:  Zane Herald, MD  CC: Hamstring injury, back pain  [id number]  Elianny L Volcy is a 59 y.o. female coming in with complaint of right hamstring injury 2 weeks. Patient states that she fell down a curb. Pain in proximal hamstring and into her groin. Also having pain in right achilles and low back down to coccyx. Remembers leg abducted when she fell.   Pain is constant. Unable to exercise. Pain increases with walking, sitting, laying and leaning forward. Standing improves her pain. Has been using Pennsaid, ice, and Motrin. Unable to sleep.  Patient has had a history of this previously.  States that this is more the contralateral side.  Does not know if it is more from the back or if it is from the hamstring.  Past Medical History:  Diagnosis Date  . Breast mass 10/25/2016   Left breast mass x months larger than a pea , MRI of Breast 10/2017 - no problem  . Complication of anesthesia   . 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 Diverticulosis   . HOH (hard of hearing)    Bilateral  . Hypothyroidism   . PONV (postoperative nausea and vomiting)   . 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 Vaginal candidiasis 07/2015   after antibiotics   Past Surgical History:  Procedure Laterality Date  . AUGMENTATION MAMMAPLASTY Bilateral 2004  . COLONOSCOPY  2014  . HYSTEROTOMY     Robotic  . LAPAROTOMY  1982  . LID  LESION EXCISION Left 12/22/2017   Procedure: LID LESION EXCISION with reconstruction of lower   left and right eye lids;  Surgeon: Clista Bernhardt, MD;  Location: [facility];  Service: Ophthalmology;  Laterality: Left;  Frozen sections  . MENISCUS REPAIR Left 1997  . TONSILLECTOMY  1966   Social History   Socioeconomic History  . Marital status: Married    Spouse name: Not on file  . Number of children: 3  . Years of education: Not on file  . Highest education level: Not on file  Occupational History  . Occupation: Therapist, sports  Tobacco Use  . Smoking status: Never Smoker  . Smokeless tobacco: Never Used  Substance and Sexual Activity  . Alcohol use: Yes    Alcohol/week: 1.0 standard drinks    Types: 1 Glasses of wine per week  . Drug use: No  . Sexual activity: Not on file  Other Topics Concern  . 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 Difficulty of Paying Living Expenses:   Food Insecurity:   . Worried About Charity fundraiser in the Last Year:   . Arboriculturist in the Last Year:   Transportation Needs:   . Film/video editor (Medical):   Marland Kitchen  Lack of Transportation (Non-Medical):   Physical Activity:   . Days of Exercise per Week:   . Minutes of Exercise per Session:   Stress:   . Feeling of Stress :   Social Connections:   . Frequency of Communication with Friends and Family:   . Frequency of Social Gatherings with Friends and Family:   . Attends Religious Services:   . Active Member of Clubs or Organizations:   . Attends Archivist Meetings:   Marland Kitchen Marital Status:    Allergies  Allergen Reactions  . Bee Venom Anaphylaxis  . Penicillins Rash and Other (See Comments)    PATIENT HAS HAD A PCN REACTION WITH IMMEDIATE RASH, FACIAL/TONGUE/THROAT SWELLING, SOB, OR LIGHTHEADEDNESS WITH HYPOTENSION:  #  #  YES  #  #   Has patient had a PCN reaction causing severe rash involving mucus membranes or skin necrosis: no Has patient had a PCN reaction that required hospitalization: no Has patient had a PCN reaction occurring within the last 10 years: no If all of the above answers are "NO", then may proceed with Cephalosporin use.   . Codeine Nausea And Vomiting  . Oxycodone Nausea And Vomiting   Family History  Problem Relation Age of Onset  . Arthritis Mother   . Hypertension Father   . Diabetes Father   . Heart disease Father   . Breast cancer Sister     Current Outpatient Medications (Endocrine & Metabolic):  .  insulin aspart (NOVOLOG FLEXPEN) 100 UNIT/ML FlexPen, Inject 2 Units into the skin daily with supper. .  insulin degludec (TRESIBA FLEXTOUCH) 100 UNIT/ML SOPN FlexTouch Pen, Inject 10 Units into the skin every evening.  .  metFORMIN (GLUCOPHAGE) 500 MG tablet, Take 500 mg by mouth 2 (two) times daily.  Marland Kitchen  PROGESTERONE MICRONIZED PO, Take 175 mg by mouth daily. **Compounded med .  thyroid (NATURE-THROID) 65 MG tablet, Take 65 mg by mouth daily. 1 grain = 64.8 mg .  predniSONE (DELTASONE) 50 MG tablet, Take one tablet daily for the next 5 days.   Current Outpatient Medications (Respiratory):  .  promethazine (PHENERGAN) 6.25 MG/5ML syrup, Take 20 mLs (25 mg total) by mouth at bedtime for 2 days.  Current Outpatient Medications (Analgesics):  .  meloxicam (MOBIC) 15 MG tablet, Take 1 tablet (15 mg total) by mouth daily.   Current Outpatient Medications (Other):  Marland Kitchen  Multiple Vitamins-Minerals (MULTIVITAMIN WITH MINERALS) tablet, Take 1 tablet by mouth daily.   Reviewed prior external information including notes and imaging from  primary care provider As well as notes that were available from care everywhere and other healthcare systems.  Past medical history, social, surgical and family history all reviewed in electronic medical record.  No pertanent information unless stated regarding  to the chief complaint.   Review of Systems:  No headache, visual changes, nausea, vomiting, diarrhea, constipation, dizziness, abdominal pain, skin rash, fevers, chills, night sweats, weight loss, swollen lymph nodes, body aches, joint swelling, chest pain, shortness of breath, mood changes. POSITIVE muscle aches  Objective  Blood pressure 130/86, pulse 70, height 5\' 2"  (1.575 m), weight 124 lb (56.2 kg), last menstrual period 08/03/2010, SpO2 99 %.   General: No apparent distress alert and oriented x3 mood and affect normal, dressed appropriately.  HEENT: Pupils equal, extraocular movements intact  Respiratory: Patient's speak in full sentences and does not appear short of breath  Cardiovascular: No lower extremity edema, non tender, no erythema  Neuro: Cranial nerves II through XII  are intact, neurovascularly intact in all extremities with 2+ DTRs and 2+ pulses.  Gait normal with good balance and coordination.  MSK:  Non tender with full range of motion and good stability and symmetric strength and tone of shoulders, elbows, wrist, hip and ankles bilaterally.  Patient's Myeong does have some very mild tightness of the right hamstring.  Pain more in the ischial bursa area.  Negative FABER test.  Does have tightness as we stated with the straight leg test.  Limited musculoskeletal ultrasound was performed and interpreted by Lyndal Pulley  Limited ultrasound of the patient's right hamstring appears to be fairly unremarkable.  Questionable small tear noted near the abductor near the pelvic bone.  Osteopathic findings C3 flexed rotated and side bent right T3 extended rotated and side bent right inhaled third rib T7 extended rotated and side bent left L2 flexed rotated and side bent right Sacrum right on right    Impression and Recommendations:     This case required medical decision making of moderate complexity. The above documentation has been reviewed and is accurate and complete  Lyndal Pulley, DO       Note: This dictation was prepared with Dragon dictation along with smaller phrase technology. Any transcriptional errors that result from this process are unintentional.

## 2019-06-19 NOTE — Patient Instructions (Signed)
Good to see you.  Ice 20 minutes 2 times daily. Usually after activity and before bed. Exercises 3 times a week.  Compression sleeve daily for one week then with activity Prednisone See me again in 3-4 weeks

## 2019-07-01 ENCOUNTER — Encounter: Payer: Self-pay | Admitting: Family Medicine

## 2019-07-12 ENCOUNTER — Other Ambulatory Visit: Payer: Self-pay

## 2019-07-12 ENCOUNTER — Encounter: Payer: Self-pay | Admitting: Family Medicine

## 2019-07-12 DIAGNOSIS — M25551 Pain in right hip: Secondary | ICD-10-CM

## 2019-07-12 DIAGNOSIS — G8929 Other chronic pain: Secondary | ICD-10-CM

## 2019-07-19 ENCOUNTER — Ambulatory Visit: Payer: BC Managed Care – PPO | Admitting: Family Medicine

## 2019-07-30 ENCOUNTER — Encounter: Payer: Self-pay | Admitting: Family Medicine

## 2019-07-30 ENCOUNTER — Other Ambulatory Visit: Payer: Self-pay

## 2019-07-30 ENCOUNTER — Ambulatory Visit (INDEPENDENT_AMBULATORY_CARE_PROVIDER_SITE_OTHER): Payer: BC Managed Care – PPO | Admitting: Family Medicine

## 2019-07-30 VITALS — BP 124/84 | HR 78 | Ht 62.0 in | Wt 121.0 lb

## 2019-07-30 DIAGNOSIS — M5416 Radiculopathy, lumbar region: Secondary | ICD-10-CM | POA: Diagnosis not present

## 2019-07-30 MED ORDER — TRAZODONE HCL 50 MG PO TABS: 25.0000 mg | ORAL_TABLET | Freq: Every evening | ORAL | 3 refills | Status: DC | PRN

## 2019-07-30 NOTE — Assessment & Plan Note (Signed)
Lumbar radiculopathy.  Discussed with activities to doing which was to avoid.  Discussed with patient about icing regimen and home exercises.  Patient's MRI does show likely potential nerve impingement that could be contributing and I do feel an epidural would be beneficial.  Patient does have foraminal narrowing, mild to moderate spinal stenosis, as well as a protruding disc at this level that could all be contributing.

## 2019-07-30 NOTE — Progress Notes (Signed)
[place] Phone: [phone] Subjective:   [name], am serving as a scribe for Dr. Hulan Saas. This visit occurred during the SARS-CoV-2 public health emergency.  Safety protocols were in place, including screening questions prior to the visit, additional usage of staff PPE, and extensive cleaning of exam room while observing appropriate contact time as indicated for disinfecting solutions.   I'm seeing this patient by the request  of:  Elizabeth Herald, MD  CC:   [id number]   06/19/2019 Recurrent with exacerbation.  Discussed compression, icing regimen, home exercise.  Patient wants to avoid significant amount of oral anti-inflammatories but does take meloxicam.  Gabapentin we have discussed previously but has had side effects.  Patient prednisone but will monitor her blood sugars on a more regular basis.  Patient is very good at checking go very well.  Patient will follow up with me again in 4 to 8 weeks to see how patient does.  Update 07/30/2019 Elizabeth [name] is a 59 y.o. female coming in with complaint of right leg pain. Patient states that she continues to have proximal hamstring pain. Pain increases at night that radiates down into the ankle. Pain is constant with varying intensities.    MRI results of back  IMPRESSION:  1. Small broad-based central disc protrusion at L5-S1. 2. Mild central canal narrowing at L3-L4 and L5-S1. 3. Minimal bilateral foraminal narrowing at L5-S1. 4. Mild scoliosis.   need epidural L5/S1  Past Medical History:  Diagnosis Date  . Breast mass 10/25/2016   Left breast mass x months larger than a pea , MRI of Breast 10/2017 - no problem  . Complication of anesthesia   . 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 Diverticulosis   . HOH (hard of hearing)    Bilateral  . Hypothyroidism   .  PONV (postoperative nausea and vomiting)   . 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 Vaginal candidiasis 07/2015   after antibiotics   Past Surgical History:  Procedure Laterality Date  . AUGMENTATION MAMMAPLASTY Bilateral 2004  . COLONOSCOPY  2014  . HYSTEROTOMY     Robotic  . LAPAROTOMY  1982  . LID LESION EXCISION Left 12/22/2017   Procedure: LID LESION EXCISION with reconstruction of lower   left and right eye lids;  Surgeon: Clista Bernhardt, MD;  Location: [facility];  Service: Ophthalmology;  Laterality: Left;  Frozen sections  . MENISCUS REPAIR Left 1997  . TONSILLECTOMY  1966   Social History   Socioeconomic History  . Marital status: Married    Spouse name: Not on file  . Number of children: 3  . Years of education: Not on file  . Highest education level: Not on file  Occupational History  . Occupation: Therapist, sports  Tobacco Use  . Smoking status: Never Smoker  . Smokeless tobacco: Never Used  Substance and Sexual Activity  . Alcohol use: Yes    Alcohol/week: 1.0 standard drinks    Types: 1 Glasses of wine per week  . Drug use: No  . Sexual activity: Not on file  Other Topics Concern  . 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 Difficulty  of Paying Living Expenses:   Food Insecurity:   . Worried About Charity fundraiser in the Last Year:   . Arboriculturist in the Last Year:   Transportation Needs:   . Film/video editor (Medical):   Marland Kitchen Lack of Transportation (Non-Medical):   Physical Activity:   . Days of Exercise per Week:   . Minutes of Exercise per Session:   Stress:   . Feeling of Stress :   Social Connections:   . Frequency of Communication with Friends and Family:   . Frequency of Social Gatherings with Friends and Family:   . Attends Religious Services:   . Active Member of Clubs or Organizations:   . Attends  Archivist Meetings:   Marland Kitchen Marital Status:    Allergies  Allergen Reactions  . Bee Venom Anaphylaxis  . Penicillins Rash and Other (See Comments)    PATIENT HAS HAD A PCN REACTION WITH IMMEDIATE RASH, FACIAL/TONGUE/THROAT SWELLING, SOB, OR LIGHTHEADEDNESS WITH HYPOTENSION:  #  #  YES  #  #  Has patient had a PCN reaction causing severe rash involving mucus membranes or skin necrosis: no Has patient had a PCN reaction that required hospitalization: no Has patient had a PCN reaction occurring within the last 10 years: no If all of the above answers are "NO", then may proceed with Cephalosporin use.   . Codeine Nausea And Vomiting  . Oxycodone Nausea And Vomiting   Family History  Problem Relation Age of Onset  . Arthritis Mother   . Hypertension Father   . Diabetes Father   . Heart disease Father   . Breast cancer Sister     Current Outpatient Medications (Endocrine & Metabolic):  .  insulin aspart (NOVOLOG FLEXPEN) 100 UNIT/ML FlexPen, Inject 2 Units into the skin daily with supper. .  insulin degludec (TRESIBA FLEXTOUCH) 100 UNIT/ML SOPN FlexTouch Pen, Inject 10 Units into the skin every evening.  .  metFORMIN (GLUCOPHAGE) 500 MG tablet, Take 500 mg by mouth 2 (two) times daily.  .  predniSONE (DELTASONE) 50 MG tablet, Take one tablet daily for the next 5 days. Marland Kitchen  PROGESTERONE MICRONIZED PO, Take 175 mg by mouth daily. **Compounded med .  thyroid (NATURE-THROID) 65 MG tablet, Take 65 mg by mouth daily. 1 grain = 64.8 mg   Current Outpatient Medications (Respiratory):  .  promethazine (PHENERGAN) 6.25 MG/5ML syrup, Take 20 mLs (25 mg total) by mouth at bedtime for 2 days.  Current Outpatient Medications (Analgesics):  .  meloxicam (MOBIC) 15 MG tablet, Take 1 tablet (15 mg total) by mouth daily.   Current Outpatient Medications (Other):  Marland Kitchen  Multiple Vitamins-Minerals (MULTIVITAMIN WITH MINERALS) tablet, Take 1 tablet by mouth daily.   Reviewed prior external  information including notes and imaging from  primary care provider As well as notes that were available from care everywhere and other healthcare systems.  Past medical history, social, surgical and family history all reviewed in electronic medical record.  No pertanent information unless stated regarding to the chief complaint.   Review of Systems:  No headache, visual changes, nausea, vomiting, diarrhea, constipation, dizziness, abdominal pain, skin rash, fevers, chills, night sweats, weight loss, swollen lymph nodes, body aches, joint swelling, chest pain, shortness of breath, mood changes. POSITIVE muscle aches  Objective  Blood pressure 124/84, pulse 78, height 5\' 2"  (1.575 m), weight 121 lb (54.9 kg), last menstrual period 08/03/2010, SpO2 99 %.   General: No apparent distress alert and  oriented x3 mood and affect normal, dressed appropriately.  HEENT: Pupils equal, extraocular movements intact  Respiratory: Patient's speak in full sentences and does not appear short of breath  Cardiovascular: No lower extremity edema, non tender, no erythema  Neuro: Cranial nerves II through XII are intact, neurovascularly intact in all extremities with 2+ DTRs and 2+ pulses.  Gait normal with good balance and coordination.  MSK:  Non tender with full range of motion and good stability and symmetric strength and tone of shoulders, elbows, wrist, hip, knee and ankles bilaterally.  Patient patient does have a positive straight test.  Back exam.  Mild loss of lordosis   Impression and Recommendations:    The above documentation has been reviewed and is accurate and complete Lyndal Pulley, DO       Note: This dictation was prepared with Dragon dictation along with smaller phrase technology. Any transcriptional errors that result from this process are unintentional.

## 2019-07-30 NOTE — Patient Instructions (Signed)
Purdy me a message 2 weeks after epidural

## 2019-08-07 ENCOUNTER — Other Ambulatory Visit: Payer: Self-pay

## 2019-08-07 ENCOUNTER — Ambulatory Visit
Admission: RE | Admit: 2019-08-07 | Discharge: 2019-08-07 | Disposition: A | Discharge status: Home / Self Care | Payer: BC Managed Care – PPO | Source: Ambulatory Visit | Attending: Family Medicine | Admitting: Family Medicine

## 2019-08-07 ENCOUNTER — Inpatient Hospital Stay: Admission: RE | Admit: 2019-08-07 | Payer: BC Managed Care – PPO | Source: Ambulatory Visit

## 2019-08-07 DIAGNOSIS — M5416 Radiculopathy, lumbar region: Secondary | ICD-10-CM

## 2019-08-07 MED ORDER — METHYLPREDNISOLONE ACETATE 40 MG/ML INJ SUSP (RADIOLOG
120.0000 mg | Freq: Once | INTRAMUSCULAR | Status: AC
  Administered 2019-08-07: 120 mg via EPIDURAL

## 2019-08-07 MED ORDER — IOPAMIDOL (ISOVUE-M 200) INJECTION 41%
1.0000 mL | Freq: Once | INTRAMUSCULAR | Status: AC
  Administered 2019-08-07: 1 mL via EPIDURAL

## 2019-08-07 NOTE — Discharge Instructions (Signed)

## 2019-08-09 ENCOUNTER — Telehealth: Payer: Self-pay | Admitting: Nurse Practitioner

## 2019-08-09 NOTE — Telephone Encounter (Signed)
Phone call from pt c.o increased blood sugars, feeling flushed and increased pain since her epidural steroid injection. Pt also experienced one episode of palpitations while flushed yesterday. Per Dr. Jeralyn Ruths, informed pt to follow up with her primary care doctor if her blood sugar remains hard to control. Pt should also call PCP if she experiences any more palpitations. Pt to take any OTC or prescription pain medicine she ordinarily takes for pain and apply ice or heat as needed. Pt to call back if symptoms continue.

## 2019-12-23 ENCOUNTER — Encounter: Payer: Self-pay | Admitting: Family Medicine

## 2019-12-23 ENCOUNTER — Ambulatory Visit (INDEPENDENT_AMBULATORY_CARE_PROVIDER_SITE_OTHER): Payer: BC Managed Care – PPO

## 2019-12-23 ENCOUNTER — Other Ambulatory Visit: Payer: Self-pay

## 2019-12-23 ENCOUNTER — Ambulatory Visit (INDEPENDENT_AMBULATORY_CARE_PROVIDER_SITE_OTHER): Payer: BC Managed Care – PPO | Admitting: Family Medicine

## 2019-12-23 ENCOUNTER — Ambulatory Visit: Payer: Self-pay

## 2019-12-23 VITALS — BP 130/90 | HR 74 | Ht 62.0 in | Wt 119.0 lb

## 2019-12-23 DIAGNOSIS — M25562 Pain in left knee: Secondary | ICD-10-CM | POA: Diagnosis not present

## 2019-12-23 DIAGNOSIS — G8929 Other chronic pain: Secondary | ICD-10-CM

## 2019-12-23 DIAGNOSIS — S83102A Unspecified subluxation of left knee, initial encounter: Secondary | ICD-10-CM | POA: Diagnosis not present

## 2019-12-23 NOTE — Patient Instructions (Addendum)
Good to see you Xray today Exercise In a aweek  See me again in within 3 weeks

## 2019-12-23 NOTE — Progress Notes (Signed)
[place] [address] [place] Phone: [phone] Subjective:   I [name] am serving as a Education administrator for Dr. Hulan Saas.  This visit occurred during the SARS-CoV-2 public health emergency.  Safety protocols were in place, including screening questions prior to the visit, additional usage of staff PPE, and extensive cleaning of exam room while observing appropriate contact time as indicated for disinfecting solutions.   I'm seeing this patient by the request  of:  Zane Herald, MD  CC: Left knee pain injury  [id number]  Jahnia L Monaco is a 59 y.o. adult coming in with complaint of left knee pain. Slipped yesterday in her yard. Heard a loud pop. Patient states she has medial and lateral pain. Walking, weight bearing, stairs and transferring weight. Extension has gotten better. 50% better than yesterday. Initially she had some swelling. Using pennsaid, ice and wrapped the knee. 7/10 at its worse.      Past Medical History:  Diagnosis Date  . Breast mass 10/25/2016   Left breast mass x months larger than a pea , MRI of Breast 10/2017 - no problem  . Complication of anesthesia   . 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 Diverticulosis   . HOH (hard of hearing)    Bilateral  . Hypothyroidism   . PONV (postoperative nausea and vomiting)   . 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 Vaginal candidiasis 07/2015   after antibiotics   Past Surgical History:  Procedure Laterality Date  . AUGMENTATION MAMMAPLASTY Bilateral 2004  . COLONOSCOPY  2014  . HYSTEROTOMY     Robotic  . LAPAROTOMY  1982  . LID LESION EXCISION Left 12/22/2017   Procedure: LID LESION EXCISION with reconstruction of lower   left and right eye lids;  Surgeon: Clista Bernhardt, MD;  Location: [facility];  Service: Ophthalmology;  Laterality: Left;  Frozen sections  . MENISCUS REPAIR Left 1997  .  TONSILLECTOMY  1966   Social History   Socioeconomic History  . Marital status: Married    Spouse name: Not on file  . Number of children: 3  . Years of education: Not on file  . Highest education level: Not on file  Occupational History  . Occupation: Therapist, sports  Tobacco Use  . Smoking status: Never Smoker  . Smokeless tobacco: Never Used  Vaping Use  . Vaping Use: Never used  Substance and Sexual Activity  . Alcohol use: Yes    Alcohol/week: 1.0 standard drink    Types: 1 Glasses of wine per week  . Drug use: No  . Sexual activity: Not on file  Other Topics Concern  . 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 Difficulty of Paying Living Expenses: Not on file  Food Insecurity:   . Worried About Charity fundraiser in the Last Year: Not on file  . Ran Out of Food in the Last Year: Not on file  Transportation Needs:   . Lack of Transportation (Medical): Not on file  . Lack of Transportation (Non-Medical): Not on file  Physical Activity:   . Days of Exercise per Week: Not on file  . Minutes of Exercise per Session: Not on file  Stress:   . Feeling of Stress : Not on file  Social Connections:   . Frequency of Communication with Friends and Family: Not on file  . Frequency of Social Gatherings with Friends and Family: Not on file  . Attends Religious Services: Not on file  . Active Member of Clubs or Organizations: Not on file  . Attends Archivist Meetings: Not on file  . Marital Status: Not on file   Allergies  Allergen Reactions  . Bee Venom Anaphylaxis  . Codeine Nausea And Vomiting  . Miconazole Itching    Monistat  . Oxycodone Nausea And Vomiting  . Penicillins Rash    PATIENT HAS HAD A PCN REACTION WITH IMMEDIATE RASH, :  #  #  YES  #  #  Has patient had a PCN reaction causing severe rash involving mucus  membranes or skin necrosis: no Has patient had a PCN reaction that required hospitalization: no Has patient had a PCN reaction occurring within the last 10 years: no If all of the above answers are "NO", then may proceed with Cephalosporin use.    Family History  Problem Relation Age of Onset  . Arthritis Mother   . Hypertension Father   . Diabetes Father   . Heart disease Father   . Breast cancer Sister     Current Outpatient Medications (Endocrine & Metabolic):  .  estradiol (VIVELLE-DOT) 0.05 MG/24HR patch, Place onto the skin. Marland Kitchen  insulin aspart (NOVOLOG FLEXPEN) 100 UNIT/ML FlexPen, Inject 2 Units into the skin daily with supper. .  insulin degludec (TRESIBA FLEXTOUCH) 100 UNIT/ML SOPN FlexTouch Pen, Inject 10 Units into the skin every evening.  .  metFORMIN (GLUCOPHAGE) 500 MG tablet, Take 500 mg by mouth 2 (two) times daily.  .  predniSONE (DELTASONE) 50 MG tablet, Take one tablet daily for the next 5 days. .  progesterone (PROMETRIUM) 200 MG capsule, Take 200 mg by mouth at bedtime. Marland Kitchen  PROGESTERONE MICRONIZED PO, Take 175 mg by mouth daily. **Compounded med .  thyroid (NATURE-THROID) 65 MG tablet, Take 65 mg by mouth daily. 1 grain = 64.8 mg    Current Outpatient Medications (Analgesics):  .  meloxicam (MOBIC) 15 MG tablet, Take 1 tablet (15 mg total) by mouth daily.   Current Outpatient Medications (Other):  Marland Kitchen  Multiple Vitamins-Minerals (MULTIVITAMIN WITH MINERALS) tablet, Take 1 tablet by mouth daily. .  traZODone (DESYREL) 50 MG tablet, Take 0.5-1 tablets (25-50 mg total) by mouth at bedtime as needed for sleep.   Reviewed prior external information including notes and imaging from  primary care provider As well as notes that were available from care everywhere and other healthcare systems.  Past medical history, social, surgical and family history all reviewed in electronic medical record.  No pertanent information unless stated regarding to the chief complaint.    Review of Systems:  No headache, visual changes, nausea, vomiting, diarrhea, constipation, dizziness, abdominal pain, skin rash, fevers, chills, night sweats, weight loss, swollen lymph nodes, body aches, joint swelling, chest pain, shortness of breath, mood changes. POSITIVE muscle aches  Objective  Blood pressure 130/90, pulse 74, height 5\' 2"  (1.575 m), weight 119 lb (54 kg), last menstrual period 08/03/2010, SpO2 98 %.   General: No apparent distress alert and oriented x3 mood and affect normal, dressed appropriately.  HEENT: Pupils equal, extraocular movements intact  Respiratory: Patient's speak in full sentences and does not appear short of breath  Cardiovascular: No lower extremity edema, non tender, no erythema  Neuro: Cranial nerves II through XII are intact, neurovascularly intact in all extremities with 2+ DTRs and 2+ pulses.  Antalgic gait Left knee shows that patient does have a trace effusion noted.  Lacks the last 10 degrees of flexion.  Patient also has a clicking sensation noted with full extension.  Mild lateral tracking of the patella.  Positive patellar grind test noted.  ACL appears to be intact but difficult to assess secondary to patient guarding somewhat.  Limited musculoskeletal ultrasound was performed and interpreted by Lyndal Pulley  Limited ultrasound of patient's left knee shows that there is a trace effusion of the patellofemoral joint area.  Mild synovitis noted.  Patient does have what appears to be more of a contusion over the posterior medial aspect of the patella noted.  No cortical defect though noted.  MCL and LCL appear to be intact. Impression: Questionable patellar contusion secondary to a subluxation      Impression and Recommendations:     The above documentation has been reviewed and is accurate and complete Lyndal Pulley, DO

## 2019-12-23 NOTE — Assessment & Plan Note (Signed)
Patient's mechanism and concern that patient did have more of a subluxation of the kneecap.  Patient had significant amount of swelling.  Appears to have all ligaments intact at the moment.  Patient given a Tru pull lite brace with her already having fairly significant.  Discussed icing regimen and home exercise, secondary to patient's diabetes will hold on any type of injection.  X-rays are pending.  Will need to monitor for possible OCD.  Follow-up again 2 to 3 weeks

## 2019-12-24 ENCOUNTER — Encounter: Payer: Self-pay | Admitting: Family Medicine

## 2020-01-13 NOTE — Progress Notes (Signed)
[name] Phone: [phone] Subjective:   [name], am serving as a scribe for Dr. Hulan Saas. This visit occurred during the SARS-CoV-2 public health emergency.  Safety protocols were in place, including screening questions prior to the visit, additional usage of staff PPE, and extensive cleaning of exam room while observing appropriate contact time as indicated for disinfecting solutions.   I'm seeing this patient by the request  of:  Zane Herald, MD  CC: Left knee pain  [id number]   12/23/2019 Patient's mechanism and concern that patient did have more of a subluxation of the kneecap.  Patient had significant amount of swelling.  Appears to have all ligaments intact at the moment.  Patient given a Tru pull lite brace with her already having fairly significant.  Discussed icing regimen and home exercise, secondary to patient's diabetes will hold on any type of injection.  X-rays are pending.  Will need to monitor for possible OCD.  Follow-up again 2 to 3 weeks  Update 01/14/2020 Elizabeth [name] is a 59 y.o. female coming in with complaint of left knee pain. Patient states that with knee extension during walking she has pinching. Unable to kneel on ground and go down stairs without pain. Pain is improving though. Uses brace with activity. Tried to walk without brace and was only able to walk 1 mile.  Patient has noticed that the twisting motions seem to give more pain.      Past Medical History:  Diagnosis Date  . Breast mass 10/25/2016   Left breast mass x months larger than a pea , MRI of Breast 10/2017 - no problem  . Complication of anesthesia   . 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 Diverticulosis   . HOH (hard of hearing)    Bilateral  . Hypothyroidism   . PONV (postoperative nausea and vomiting)   . Salmonella  dysentery 06/2015  . Vaginal candidiasis 07/2015   after antibiotics   Past Surgical History:  Procedure Laterality Date  . AUGMENTATION MAMMAPLASTY Bilateral 2004  . COLONOSCOPY  2014  . HYSTEROTOMY     Robotic  . LAPAROTOMY  1982  . LID LESION EXCISION Left 12/22/2017   Procedure: LID LESION EXCISION with reconstruction of lower   left and right eye lids;  Surgeon: Clista Bernhardt, MD;  Location: [place];  Service: Ophthalmology;  Laterality: Left;  Frozen sections  . MENISCUS REPAIR Left 1997  . TONSILLECTOMY  1966   Social History   Socioeconomic History  . Marital status: Married    Spouse name: Not on file  . Number of children: 3  . Years of education: Not on file  . Highest education level: Not on file  Occupational History  . Occupation: Therapist, sports  Tobacco Use  . Smoking status: Never Smoker  . Smokeless tobacco: Never Used  Vaping Use  . Vaping Use: Never used  Substance and Sexual Activity  . Alcohol use: Yes    Alcohol/week: 1.0 standard drink    Types: 1 Glasses of wine per week  . Drug use: No  . Sexual activity: Not on file  Other Topics Concern  . 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 Difficulty of Paying Living Expenses: Not on file  Food Insecurity:   . Worried About Charity fundraiser in the Last Year: Not on file  . Ran Out of Food in the Last Year: Not on file  Transportation Needs:   . Lack of Transportation (Medical): Not on file  . Lack of Transportation (Non-Medical): Not on file  Physical Activity:   . Days of Exercise per Week: Not on file  . Minutes of Exercise per Session: Not on file  Stress:   . Feeling of Stress : Not on file  Social Connections:   . Frequency of Communication with Friends and Family: Not on file  . Frequency of Social Gatherings with Friends and Family: Not on file    . Attends Religious Services: Not on file  . Active Member of Clubs or Organizations: Not on file  . Attends Archivist Meetings: Not on file  . Marital Status: Not on file   Allergies  Allergen Reactions  . Bee Venom Anaphylaxis  . Codeine Nausea And Vomiting  . Miconazole Itching    Monistat  . Oxycodone Nausea And Vomiting  . Penicillins Rash    PATIENT HAS HAD A PCN REACTION WITH IMMEDIATE RASH, :  #  #  YES  #  #  Has patient had a PCN reaction causing severe rash involving mucus membranes or skin necrosis: no Has patient had a PCN reaction that required hospitalization: no Has patient had a PCN reaction occurring within the last 10 years: no If all of the above answers are "NO", then may proceed with Cephalosporin use.    Family History  Problem Relation Age of Onset  . Arthritis Mother   . Hypertension Father   . Diabetes Father   . Heart disease Father   . Breast cancer Sister     Current Outpatient Medications (Endocrine & Metabolic):  .  estradiol (VIVELLE-DOT) 0.05 MG/24HR patch, Place onto the skin. Marland Kitchen  insulin aspart (NOVOLOG FLEXPEN) 100 UNIT/ML FlexPen, Inject 2 Units into the skin daily with supper. .  insulin degludec (TRESIBA FLEXTOUCH) 100 UNIT/ML SOPN FlexTouch Pen, Inject 10 Units into the skin every evening.  .  metFORMIN (GLUCOPHAGE) 500 MG tablet, Take 500 mg by mouth 2 (two) times daily.  .  predniSONE (DELTASONE) 50 MG tablet, Take one tablet daily for the next 5 days. .  progesterone (PROMETRIUM) 200 MG capsule, Take 200 mg by mouth at bedtime. Marland Kitchen  PROGESTERONE MICRONIZED PO, Take 175 mg by mouth daily. **Compounded med .  thyroid (NATURE-THROID) 65 MG tablet, Take 65 mg by mouth daily. 1 grain = 64.8 mg    Current Outpatient Medications (Analgesics):  .  meloxicam (MOBIC) 15 MG tablet, Take 1 tablet (15 mg total) by mouth daily.   Current Outpatient Medications (Other):  Marland Kitchen  Multiple Vitamins-Minerals (MULTIVITAMIN WITH MINERALS)  tablet, Take 1 tablet by mouth daily. .  traZODone (DESYREL) 50 MG tablet, Take 0.5-1 tablets (25-50 mg total) by mouth at bedtime as needed for sleep.   Reviewed prior external information including notes and imaging from  primary care provider As well as notes that were available from care everywhere and other healthcare systems.  Past medical history, social, surgical and family history all reviewed in electronic medical record.  No pertanent information unless stated regarding to the chief complaint.   Review of Systems:  No headache, visual changes, nausea, vomiting, diarrhea, constipation, dizziness, abdominal pain, skin rash, fevers,  chills, night sweats, weight loss, swollen lymph nodes, body aches, joint swelling, chest pain, shortness of breath, mood changes. POSITIVE muscle aches  Objective  Last menstrual period 08/03/2010.   General: No apparent distress alert and oriented x3 mood and affect normal, dressed appropriately.  HEENT: Pupils equal, extraocular movements intact  Respiratory: Patient's speak in full sentences and does not appear short of breath  Cardiovascular: No lower extremity edema, non tender, no erythema  Gait mild antalgic favoring the left knee.  Left knee shows the patient does have very mild lateral tracking of the patella. Patient does have full range of motion. Positive McMurray's which is new. More tenderness over the medial joint line and posteriorly. No masses appreciated.  Limited musculoskeletal ultrasound was performed and interpreted by Lyndal Pulley  Limited ultrasound shows the patient's effusion previously noted has completely resolved. Patient does have now what can be seen as a medial meniscal tear but appears to be acute on chronic. Mild displacement of 10 to 15% noted. Seems to be more of the posterior medial aspect. Impression: Medial meniscal tear with mild displacement   Impression and Recommendations:     The above documentation has  been reviewed and is accurate and complete Lyndal Pulley, DO

## 2020-01-14 ENCOUNTER — Encounter: Payer: Self-pay | Admitting: Family Medicine

## 2020-01-14 ENCOUNTER — Ambulatory Visit: Payer: Self-pay

## 2020-01-14 ENCOUNTER — Other Ambulatory Visit: Payer: Self-pay

## 2020-01-14 ENCOUNTER — Ambulatory Visit (INDEPENDENT_AMBULATORY_CARE_PROVIDER_SITE_OTHER): Payer: BC Managed Care – PPO | Admitting: Family Medicine

## 2020-01-14 VITALS — HR 78 | Ht 62.0 in | Wt 117.0 lb

## 2020-01-14 DIAGNOSIS — M25562 Pain in left knee: Secondary | ICD-10-CM

## 2020-01-14 DIAGNOSIS — G8929 Other chronic pain: Secondary | ICD-10-CM | POA: Diagnosis not present

## 2020-01-14 DIAGNOSIS — M23204 Derangement of unspecified medial meniscus due to old tear or injury, left knee: Secondary | ICD-10-CM | POA: Diagnosis not present

## 2020-01-14 NOTE — Assessment & Plan Note (Signed)
Patient on ultrasound today with a decrease in the inflammation shows the patient does have what appears to be a medial meniscal tear. Seems to be very slight and more degenerative. Mild displacement potentially noted. We discussed the brace, home exercise anti-inflammatories as well as icing regimen. Follow-up with me again in 5 to 6 weeks. Continued pain consider the possibility of MRI

## 2020-01-14 NOTE — Patient Instructions (Signed)
Meniscus  Ok to bike  Walking in straight line with brace Ice after activity Porg See me again in 5-6 weeks

## 2020-02-12 ENCOUNTER — Other Ambulatory Visit: Payer: Self-pay | Admitting: Infectious Diseases

## 2020-02-12 ENCOUNTER — Telehealth: Payer: Self-pay | Admitting: Infectious Diseases

## 2020-02-12 ENCOUNTER — Ambulatory Visit (HOSPITAL_COMMUNITY)
Admission: RE | Admit: 2020-02-12 | Discharge: 2020-02-12 | Disposition: A | Discharge status: Home / Self Care | Payer: BC Managed Care – PPO | Source: Ambulatory Visit | Attending: Pulmonary Disease | Admitting: Pulmonary Disease

## 2020-02-12 DIAGNOSIS — U071 COVID-19: Secondary | ICD-10-CM

## 2020-02-12 DIAGNOSIS — E109 Type 1 diabetes mellitus without complications: Secondary | ICD-10-CM

## 2020-02-12 MED ORDER — SOTROVIMAB 500 MG/8ML IV SOLN
500.0000 mg | Freq: Once | INTRAVENOUS | Status: AC
  Administered 2020-02-12: 500 mg via INTRAVENOUS

## 2020-02-12 MED ORDER — SODIUM CHLORIDE 0.9 % IV SOLN: INTRAVENOUS | Status: DC | PRN

## 2020-02-12 MED ORDER — FAMOTIDINE IN NACL 20-0.9 MG/50ML-% IV SOLN: 20.0000 mg | Freq: Once | INTRAVENOUS | Status: DC | PRN

## 2020-02-12 MED ORDER — DIPHENHYDRAMINE HCL 50 MG/ML IJ SOLN: 50.0000 mg | Freq: Once | INTRAMUSCULAR | Status: DC | PRN

## 2020-02-12 MED ORDER — ALBUTEROL SULFATE HFA 108 (90 BASE) MCG/ACT IN AERS: 2.0000 | INHALATION_SPRAY | Freq: Once | RESPIRATORY_TRACT | Status: DC | PRN

## 2020-02-12 MED ORDER — EPINEPHRINE 0.3 MG/0.3ML IJ SOAJ: 0.3000 mg | Freq: Once | INTRAMUSCULAR | Status: DC | PRN

## 2020-02-12 MED ORDER — METHYLPREDNISOLONE SODIUM SUCC 125 MG IJ SOLR: 125.0000 mg | Freq: Once | INTRAMUSCULAR | Status: DC | PRN

## 2020-02-12 NOTE — Progress Notes (Signed)
Diagnosis: COVID-19  Physician: Dr. Patrick Wright  Procedure: Covid Infusion Clinic Med: Sotrovimab infusion - Provided patient with sotrovimab fact sheet for patients, parents, and caregivers prior to infusion.   Complications: No immediate complications noted  Discharge: Discharged home  If after the infusion you have any questions or concerns please call the Advanced Practice Provider at [phone] 

## 2020-02-12 NOTE — Progress Notes (Signed)
I connected by phone with [name] on 02/12/2020 at 10:39 AM to discuss the potential use of a new treatment for mild to moderate COVID-19 viral infection in non-hospitalized patients.  This patient is a 59 y.o. female that meets the FDA criteria for Emergency Use Authorization of COVID monoclonal antibody casirivimab/imdevimab, bamlanivimab/eteseviamb, or sotrovimab.  Has a (+) direct SARS-CoV-2 viral test result  Has mild or moderate COVID-19   Is NOT hospitalized due to COVID-19  Is within 10 days of symptom onset  Has at least one of the high risk factor(s) for progression to severe COVID-19 and/or hospitalization as defined in EUA.  Specific high risk criteria : Diabetes   I have spoken and communicated the following to the patient or parent/caregiver regarding COVID monoclonal antibody treatment:  1. FDA has authorized the emergency use for the treatment of mild to moderate COVID-19 in adults and pediatric patients with positive results of direct SARS-CoV-2 viral testing who are 71 years of age and older weighing at least 40 kg, and who are at high risk for progressing to severe COVID-19 and/or hospitalization.  2. The significant known and potential risks and benefits of COVID monoclonal antibody, and the extent to which such potential risks and benefits are unknown.  3. Information on available alternative treatments and the risks and benefits of those alternatives, including clinical trials.  4. Patients treated with COVID monoclonal antibody should continue to self-isolate and use infection control measures (e.g., wear mask, isolate, social distance, avoid sharing personal items, clean and disinfect "high touch" surfaces, and frequent handwashing) according to CDC guidelines.   5. The patient or parent/caregiver has the option to accept or refuse COVID monoclonal antibody treatment.  After reviewing this information with the patient, the patient has agreed to receive one  of the available covid 19 monoclonal antibodies and will be provided an appropriate fact sheet prior to infusion. Janene Madeira, NP 02/12/2020 10:39 AM

## 2020-02-12 NOTE — Telephone Encounter (Signed)
Called to Discuss with patient about Covid symptoms and the use of the monoclonal antibody infusion for those with mild to moderate Covid symptoms and at a high risk of hospitalization.     Pt appears to qualify for this infusion due to co-morbid conditions and/or a member of an at-risk group in accordance with the FDA Emergency Use Authorization.   Sx day 7 today. Qualifies with Type1DM history.   Scheduled today at 3:30 pm    Janene Madeira, MSN, NP-C [facility] for Infectious [email or web address] Pager: [phone] Office: [phone] [place]: [phone]

## 2020-02-12 NOTE — Discharge Instructions (Addendum)

## 2020-02-17 ENCOUNTER — Ambulatory Visit: Payer: BC Managed Care – PPO | Admitting: Family Medicine

## 2020-02-18 ENCOUNTER — Ambulatory Visit: Payer: BC Managed Care – PPO

## 2020-03-11 NOTE — Progress Notes (Signed)
[place] Phone: [phone] Subjective:    I'm seeing this patient by the request  of:  Zane Herald, MD  CC: Left knee pain  [id number]   01/14/2020 Patient on ultrasound today with a decrease in the inflammation shows the patient does have what appears to be a medial meniscal tear. Seems to be very slight and more degenerative. Mild displacement potentially noted. We discussed the brace, home exercise anti-inflammatories as well as icing regimen. Follow-up with me again in 5 to 6 weeks. Continued pain consider the possibility of MRI  Update 03/11/2020 Elizabeth [name] is a 59 y.o. female coming in with complaint of left knee pain. Patient states that she has had improvement. Has pain with activity and intermittent giving out. Inclines bother her if she is walking. Patient sits with left leg under her and she has a hard time walking after being in this position.  Patient does have some instability of the knee from time to time.  Does a little block on her from time to time.  Patient states that she sits in a certain position and has increasing discomfort.  Does still have some mild anterior knee pain as well.  Patient is concerned because she has not been able to do anything other than biking and walking but even if she walks uphill starts having increasing discomfort and pain      Past Medical History:  Diagnosis Date  . Breast mass 10/25/2016   Left breast mass x months larger than a pea , MRI of Breast 10/2017 - no problem  . Complication of anesthesia   . 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 Diverticulosis   . HOH (hard of hearing)    Bilateral  . Hypothyroidism   . PONV (postoperative nausea and vomiting)   . 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 Vaginal candidiasis 07/2015   after antibiotics   Past Surgical History:  Procedure  Laterality Date  . AUGMENTATION MAMMAPLASTY Bilateral 2004  . COLONOSCOPY  2014  . HYSTEROTOMY     Robotic  . LAPAROTOMY  1982  . LID LESION EXCISION Left 12/22/2017   Procedure: LID LESION EXCISION with reconstruction of lower   left and right eye lids;  Surgeon: Clista Bernhardt, MD;  Location: [place];  Service: Ophthalmology;  Laterality: Left;  Frozen sections  . MENISCUS REPAIR Left 1997  . TONSILLECTOMY  1966   Social History   Socioeconomic History  . Marital status: Married    Spouse name: Not on file  . Number of children: 3  . Years of education: Not on file  . Highest education level: Not on file  Occupational History  . Occupation: Therapist, sports  Tobacco Use  . Smoking status: Never Smoker  . Smokeless tobacco: Never Used  Vaping Use  . Vaping Use: Never used  Substance and Sexual Activity  . Alcohol use: Yes    Alcohol/week: 1.0 standard drink    Types: 1 Glasses of wine per week  . Drug use: No  . Sexual activity: Not on file  Other Topics Concern  . 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 Bee Venom Anaphylaxis  . Codeine Nausea And Vomiting  . Miconazole Itching    Monistat  . Oxycodone Nausea And Vomiting  . Penicillins Rash    PATIENT HAS HAD A PCN REACTION WITH IMMEDIATE RASH, :  #  #  YES  #  #  Has patient had a PCN reaction causing severe rash involving mucus membranes or skin necrosis: no Has patient had a PCN reaction that required hospitalization: no Has patient had a PCN reaction occurring within the last 10 years: no If all of the above answers are "NO", then may proceed with Cephalosporin use.     Family History  Problem Relation Age of Onset  . Arthritis Mother   . Hypertension Father   . Diabetes Father   . Heart disease Father   . Breast cancer Sister     Current Outpatient Medications (Endocrine & Metabolic):  .  estradiol (VIVELLE-DOT) 0.05 MG/24HR patch, Place onto the skin. Marland Kitchen  insulin aspart (NOVOLOG) 100 UNIT/ML FlexPen, Inject 2 Units into the skin daily with supper. .  insulin degludec (TRESIBA) 100 UNIT/ML FlexTouch Pen, Inject 10 Units into the skin every evening.  .  metFORMIN (GLUCOPHAGE) 500 MG tablet, Take 500 mg by mouth 2 (two) times daily.  .  predniSONE (DELTASONE) 50 MG tablet, Take one tablet daily for the next 5 days. .  progesterone (PROMETRIUM) 200 MG capsule, Take 200 mg by mouth at bedtime. Marland Kitchen  PROGESTERONE MICRONIZED PO, Take 175 mg by mouth daily. **Compounded med .  thyroid (ARMOUR) 65 MG tablet, Take 65 mg by mouth daily. 1 grain = 64.8 mg    Current Outpatient Medications (Analgesics):  .  meloxicam (MOBIC) 15 MG tablet, Take 1 tablet (15 mg total) by mouth daily.   Current Outpatient Medications (Other):  Marland Kitchen  Multiple Vitamins-Minerals (MULTIVITAMIN WITH MINERALS) tablet, Take 1 tablet by mouth daily. .  traZODone (DESYREL) 50 MG tablet, Take 0.5-1 tablets (25-50 mg total) by mouth at bedtime as needed for sleep.   Reviewed prior external information including notes and imaging from  primary care provider As well as notes that were available from care everywhere and other healthcare systems.  Past medical history, social, surgical and family history all reviewed in electronic medical record.  No pertanent information unless stated regarding to the chief complaint.   Review of Systems:  No headache, visual changes, nausea, vomiting, diarrhea, constipation, dizziness, abdominal pain, skin rash, fevers, chills, night sweats, weight loss, swollen lymph nodes, body aches, joint swelling, chest pain, shortness of breath, mood changes. POSITIVE  muscle aches  Objective  Blood pressure 136/82, pulse 77, height 5\' 2"  (1.575 m), weight 120 lb (54.4 kg), last menstrual period 08/03/2010, SpO2 99 %.   General: No apparent distress alert and oriented x3 mood and affect normal, dressed appropriately.  HEENT: Pupils equal, extraocular movements intact  Respiratory: Patient's speak in full sentences and does not appear short of breath  Cardiovascular: No lower extremity edema, non tender, no erythema  Neuro: Cranial nerves II through XII are intact, neurovascularly intact in all extremities with 2+ DTRs and 2+ pulses.  Gait normal with good balance and coordination.  MSK: Left knee exam shows the patient does has good stability.  No significant instability.  Positive McMurray's.  Severe tenderness over the medial joint  line.  Limited musculoskeletal ultrasound was performed and interpreted by Lyndal Pulley  Limited ultrasound of patient's left knee shows that patient does not have any true swelling.  Patient does have a very mild irregularity on the posterior aspect of the patella.  Patient also has still degenerative changes of the medial meniscus.  He does still have some mild displacement of the posterior aspect of this. Impression: Medial meniscal tear questionable irregularity of the posterior patella no effusion    Impression and Recommendations:     The above documentation has been reviewed and is accurate and complete Lyndal Pulley, DO

## 2020-03-12 ENCOUNTER — Other Ambulatory Visit: Payer: Self-pay

## 2020-03-12 ENCOUNTER — Encounter: Payer: Self-pay | Admitting: Family Medicine

## 2020-03-12 ENCOUNTER — Ambulatory Visit (INDEPENDENT_AMBULATORY_CARE_PROVIDER_SITE_OTHER): Payer: BC Managed Care – PPO | Admitting: Family Medicine

## 2020-03-12 ENCOUNTER — Ambulatory Visit: Payer: Self-pay

## 2020-03-12 VITALS — BP 136/82 | HR 77 | Ht 62.0 in | Wt 120.0 lb

## 2020-03-12 DIAGNOSIS — M25562 Pain in left knee: Secondary | ICD-10-CM

## 2020-03-12 DIAGNOSIS — M23204 Derangement of unspecified medial meniscus due to old tear or injury, left knee: Secondary | ICD-10-CM | POA: Diagnosis not present

## 2020-03-12 NOTE — Patient Instructions (Signed)
MRI left knee [facility] Ser [phone] before bed Will discuss next steps once we have results

## 2020-03-12 NOTE — Assessment & Plan Note (Signed)
Patient still shows signs of symptoms that is consistent with more of the medial meniscus.  I do not feel any significant instability.  Patient wants to avoid the injection but is having some instability and locking of the knee.  At this time I do feel that advanced imaging is warranted.  Patient would consider the possibility of surgery but of course wants to avoid it if possible.  We will discuss with patient after further imaging for this problem.

## 2020-03-24 NOTE — Progress Notes (Unsigned)
[place] Phone: [phone] Subjective:   [name], am serving as a scribe for Dr. Hulan Saas. This visit occurred during the SARS-CoV-2 public health emergency.  Safety protocols were in place, including screening questions prior to the visit, additional usage of staff PPE, and extensive cleaning of exam room while observing appropriate contact time as indicated for disinfecting solutions.   I'm seeing this patient by the request  of:  Zane Herald, MD  CC: Left knee pain follow-up  [id number]   03/12/2020 Patient still shows signs of symptoms that is consistent with more of the medial meniscus.  I do not feel any significant instability.  Patient wants to avoid the injection but is having some instability and locking of the knee.  At this time I do feel that advanced imaging is warranted.  Patient would consider the possibility of surgery but of course wants to avoid it if possible.  We will discuss with patient after further imaging for this problem.  Update 03/25/2020 Austyn MAELIE SIRMAN is a 60 y.o. female coming in with complaint of left knee pain.  Patient was failing all conservative therapy.  Patient had more pain seemed to be consistent with more of the medial meniscus.  Patient has had some instability in the locking of the knee and was sent for advanced imaging. Patient continues to have pain with sitting prolonged periods. Knee gave out a couple of times last week.   Left knee MRI shows that patient does have some degenerative subchondral edema in the medial femoral condyle as well as some mild full pole full-thickness degenerative changes of the lateral tibial plateau.  Patient does appear to have a complex tear of the body as well as the posterior horn of the medial meniscus and likely a tear of the anterior horn of the lateral meniscus.  Mild increased signal of the ACL noted but likely not true  tear questionable low-grade tear.    Past Medical History:  Diagnosis Date  . Breast mass 10/25/2016   Left breast mass x months larger than a pea , MRI of Breast 10/2017 - no problem  . Complication of anesthesia   . Diabetes mellitus without complication (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 Diverticulosis   . HOH (hard of hearing)    Bilateral  . Hypothyroidism   . PONV (postoperative nausea and vomiting)   . 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 Vaginal candidiasis 07/2015   after antibiotics   Past Surgical History:  Procedure Laterality Date  . AUGMENTATION MAMMAPLASTY Bilateral 2004  . COLONOSCOPY  2014  . HYSTEROTOMY     Robotic  . LAPAROTOMY  1982  . LID LESION EXCISION Left 12/22/2017   Procedure: LID LESION EXCISION with reconstruction of lower   left and right eye lids;  Surgeon: Clista Bernhardt, MD;  Location: [place];  Service: Ophthalmology;  Laterality: Left;  Frozen sections  . MENISCUS REPAIR Left 1997  . TONSILLECTOMY  1966   Social History   Socioeconomic History  . Marital status: Married    Spouse name: Not on file  . Number of children: 3  . Years of education: Not on file  . Highest education level: Not on file  Occupational History  . Occupation: Therapist, sports  Tobacco Use  . Smoking status: Never Smoker  . Smokeless tobacco: Never Used  Vaping Use  .  Vaping Use: Never used  Substance and Sexual Activity  . Alcohol use: Yes    Alcohol/week: 1.0 standard drink    Types: 1 Glasses of wine per week  . Drug use: No  . Sexual activity: Not on file  Other Topics Concern  . 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 Bee Venom Anaphylaxis  . Codeine Nausea And Vomiting  . Miconazole Itching    Monistat  . Oxycodone Nausea And Vomiting  . Penicillins Rash    PATIENT HAS HAD A PCN REACTION WITH IMMEDIATE RASH, :  #  #  YES  #  #  Has patient had a PCN reaction causing severe rash involving mucus membranes or skin necrosis: no Has patient had a PCN reaction that required hospitalization: no Has patient had a PCN reaction occurring within the last 10 years: no If all of the above answers are "NO", then may proceed with Cephalosporin use.    Family History  Problem Relation Age of Onset  . Arthritis Mother   . Hypertension Father   . Diabetes Father   . Heart disease Father   . Breast cancer Sister     Current Outpatient Medications (Endocrine & Metabolic):  .  estradiol (VIVELLE-DOT) 0.05 MG/24HR patch, Place onto the skin. [place]  insulin aspart (NOVOLOG) 100 UNIT/ML FlexPen, Inject 2 Units into the skin daily with supper. .  insulin degludec (TRESIBA) 100 UNIT/ML FlexTouch Pen, Inject 10 Units into the skin every evening.  .  metFORMIN (GLUCOPHAGE) 500 MG tablet, Take 500 mg by mouth 2 (two) times daily.  .  predniSONE (DELTASONE) 50 MG tablet, Take one tablet daily for the next 5 days. .  progesterone (PROMETRIUM) 200 MG capsule, Take 200 mg by mouth at bedtime. [place] Kitchen  PROGESTERONE MICRONIZED PO, Take 175 mg by mouth daily. **Compounded med .  thyroid (ARMOUR) 65 MG tablet, Take 65 mg by mouth daily. 1 grain = 64.8 mg    Current Outpatient Medications (Analgesics):  .  meloxicam (MOBIC) 15 MG tablet, Take 1 tablet (15 mg total) by mouth daily.   Current Outpatient Medications (Other):  [place] Kitchen  Multiple Vitamins-Minerals (MULTIVITAMIN WITH MINERALS) tablet, Take 1 tablet by mouth daily. .  traZODone (DESYREL) 50 MG tablet, Take 0.5-1 tablets (25-50 mg total) by mouth at bedtime as needed for sleep.   Reviewed prior external information including notes and imaging  from  primary care provider As well as notes that were available from care everywhere and other healthcare systems.  Past medical history, social, surgical and family history all reviewed in electronic medical record.  No pertanent information unless stated regarding to the chief complaint.   Review of Systems:  No headache, visual changes, nausea, vomiting, diarrhea, constipation, dizziness, abdominal pain, skin rash, fevers, chills, night sweats, weight loss, swollen lymph nodes, body aches, joint swelling, chest pain, shortness of breath, mood changes. POSITIVE muscle aches  Objective  Blood pressure 124/86, pulse 75, height 5\' 2"  (1.575 m), weight 121 lb (54.9 kg), last menstrual period 08/03/2010, SpO2 99 %.   General: No apparent distress  alert and oriented x3 mood and affect normal, dressed appropriately.  HEENT: Pupils equal, extraocular movements intact  Respiratory: Patient's speak in full sentences and does not appear short of breath  Cardiovascular: No lower extremity edema, non tender, no erythema  Gait normal with good balance and coordination.  MSK: Left knee exam positive McMurray's.  Still has tenderness to palpation over the medial joint line.  Patient does have a positive patellar grind noted.    Impression and Recommendations:     The above documentation has been reviewed and is accurate and complete Lyndal Pulley, DO

## 2020-03-25 ENCOUNTER — Ambulatory Visit (INDEPENDENT_AMBULATORY_CARE_PROVIDER_SITE_OTHER): Payer: BC Managed Care – PPO | Admitting: Family Medicine

## 2020-03-25 ENCOUNTER — Other Ambulatory Visit: Payer: Self-pay

## 2020-03-25 ENCOUNTER — Encounter: Payer: Self-pay | Admitting: Family Medicine

## 2020-03-25 VITALS — BP 124/86 | HR 75 | Ht 62.0 in | Wt 121.0 lb

## 2020-03-25 DIAGNOSIS — M23204 Derangement of unspecified medial meniscus due to old tear or injury, left knee: Secondary | ICD-10-CM | POA: Diagnosis not present

## 2020-03-25 DIAGNOSIS — M25562 Pain in left knee: Secondary | ICD-10-CM

## 2020-03-25 DIAGNOSIS — G8929 Other chronic pain: Secondary | ICD-10-CM | POA: Diagnosis not present

## 2020-03-25 NOTE — Assessment & Plan Note (Addendum)
Left knee MRI shows that patient does have some degenerative subchondral edema in the medial femoral condyle as well as some mild full pole full-thickness degenerative changes of the lateral tibial plateau.  Patient does appear to have a complex tear of the body as well as the posterior horn of the medial meniscus and likely a tear of the anterior horn of the lateral meniscus.  Mild increased signal of the ACL noted but likely not true tear questionable low-grade tear.    MRI is consistent with the large tear noted.  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  I believe patient though is young and active that may be able to get away with an arthroscopic procedure and will be referred appropriately to discuss potential surgical intervention.  Patient is in agreement with the plan.  Patient can follow-up with me as needed

## 2020-03-25 NOTE — Patient Instructions (Signed)
Good to see you Elizabeth [name] [phone] Send Message with other information If they have any questions you know where I am

## 2020-05-25 ENCOUNTER — Telehealth: Payer: Self-pay | Admitting: *Deleted

## 2020-05-25 NOTE — Telephone Encounter (Signed)
Recv'd records from [place] forwarded 2 pages to Dr. Hulan Saas 3/7/22fbg

## 2021-03-11 ENCOUNTER — Telehealth: Payer: Self-pay | Admitting: *Deleted

## 2021-03-11 NOTE — Telephone Encounter (Signed)
Recv'd medical records from [facility] forwarded 2 pages to Dr. Hulan Saas 12/22/22fbg

## 2021-06-28 ENCOUNTER — Telehealth: Payer: Self-pay

## 2021-06-28 DIAGNOSIS — Z8249 Family history of ischemic heart disease and other diseases of the circulatory system: Secondary | ICD-10-CM

## 2021-06-28 NOTE — Telephone Encounter (Signed)
-----   Message from [place]ston Diones, [place] sent at 06/28/2021  1:31 PM EDT ----- ?Regarding: Outside Order For Calcium Score ?I received this outside order for calcium score from Dr Erline Levine. Diagnosis includes, family history, cardiovascular disease, diabetic and something else I can't make out. The form is scanned in if yall need to look at it. Please place it under Dr. Audie Box .  ? ?Thanks! ?Elizabeth [name]  ? ?

## 2021-06-28 NOTE — Telephone Encounter (Signed)
See below message from [facility] in radiology. Order placed at this time and verified Dr. Cassie Freer O'Neal per Nunzio Cobbs. ?

## 2021-07-23 ENCOUNTER — Other Ambulatory Visit: Payer: Self-pay | Admitting: Plastic Surgery

## 2021-08-10 ENCOUNTER — Encounter: Payer: Self-pay | Admitting: Plastic Surgery

## 2021-08-10 ENCOUNTER — Ambulatory Visit (INDEPENDENT_AMBULATORY_CARE_PROVIDER_SITE_OTHER): Payer: BC Managed Care – PPO | Admitting: Plastic Surgery

## 2021-08-10 DIAGNOSIS — L988 Other specified disorders of the skin and subcutaneous tissue: Secondary | ICD-10-CM | POA: Diagnosis not present

## 2021-08-10 DIAGNOSIS — L989 Disorder of the skin and subcutaneous tissue, unspecified: Secondary | ICD-10-CM

## 2021-08-10 NOTE — Progress Notes (Signed)
Patient ID: Elizabeth [name], female    DOB: 05/05/1960, 61 y.o.   MRN: [id number]   Chief Complaint  Patient presents with   Consult   Skin Problem    The patient is a 61 year old female here for evaluation of her back.  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  These were excised by Dr. Harlow Mares.  She has 1 in the central area that was excised 6 months ago.  She is concerned about the widening of the scar.  She has a wedding planned with the particular address she wants to wear.  She is wondering if anything can be done to get it to look a little better.  I do not think she should do another excision prior to the wedding.  She might respond well to laser.   Review of Systems  Constitutional: Negative.   Eyes: Negative.   Respiratory: Negative.  Negative for chest tightness.   Cardiovascular: Negative.   Gastrointestinal: Negative.   Endocrine: Negative.   Genitourinary: Negative.   Musculoskeletal: Negative.   Skin:  Positive for color change.   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   Diverticulosis    HOH (hard of hearing)    Bilateral   Hypothyroidism    PONV (postoperative nausea and vomiting)    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   Vaginal candidiasis 07/2015   after antibiotics    Past Surgical History:  Procedure Laterality Date   AUGMENTATION MAMMAPLASTY Bilateral 2004   COLONOSCOPY  2014   HYSTEROTOMY     Robotic   LAPAROTOMY  1982   LID LESION EXCISION Left 12/22/2017   Procedure: LID LESION EXCISION with reconstruction of lower   left and right eye lids;  Surgeon: Clista Bernhardt, MD;  Location: [place];  Service: Ophthalmology;  Laterality: Left;   Frozen sections   MENISCUS REPAIR Left 1997   TONSILLECTOMY  1966      Current Outpatient Medications:    insulin aspart (NOVOLOG) 100 UNIT/ML FlexPen, Inject 2 Units into the skin daily with supper., Disp: , Rfl:    insulin degludec (TRESIBA) 100 UNIT/ML FlexTouch Pen, Inject 10 Units into the skin every evening. , Disp: , Rfl:    levothyroxine (SYNTHROID) 50 MCG tablet, Take 50 tablets by mouth daily., Disp: , Rfl:    Multiple Vitamins-Minerals (MULTIVITAMIN WITH MINERALS) tablet, Take 1 tablet by mouth daily., Disp: , Rfl:    progesterone (PROMETRIUM) 200 MG capsule, Take 200 mg by mouth at bedtime., Disp: , Rfl:    Objective:   Vitals:   08/10/21 0925  BP: (!) 154/73  Pulse: 68  SpO2: 100%    Physical Exam Vitals and nursing note reviewed.  Constitutional:      Appearance: Normal appearance.  HENT:     Head: Normocephalic and atraumatic.  Cardiovascular:     Rate and Rhythm: Normal rate.     Pulses: Normal pulses.  Pulmonary:     Effort: Pulmonary effort is normal.  Musculoskeletal:       Arms:  Skin:    General: Skin is warm.     Capillary Refill: Capillary refill takes less than  2 seconds.     Coloration: Skin is not jaundiced.     Findings: Lesion present. No bruising.  Neurological:     Mental Status: She is alert and oriented to person, place, and time.  Psychiatric:        Mood and Affect: Mood normal.        Behavior: Behavior normal.        Thought Content: Thought content normal.    Assessment & Plan:  Skin lesion  Recommend laser to central back scar.  It may take several times but she won't be harmed and it may help concealed the scar.   [place], DO

## 2021-08-24 ENCOUNTER — Ambulatory Visit (INDEPENDENT_AMBULATORY_CARE_PROVIDER_SITE_OTHER)
Admission: RE | Admit: 2021-08-24 | Discharge: 2021-08-24 | Disposition: A | Discharge status: Home / Self Care | Payer: Self-pay | Source: Ambulatory Visit | Attending: Cardiovascular Disease | Admitting: Cardiovascular Disease

## 2021-08-24 ENCOUNTER — Ambulatory Visit (INDEPENDENT_AMBULATORY_CARE_PROVIDER_SITE_OTHER): Payer: Self-pay | Admitting: Plastic Surgery

## 2021-08-24 ENCOUNTER — Encounter: Payer: Self-pay | Admitting: Plastic Surgery

## 2021-08-24 DIAGNOSIS — L905 Scar conditions and fibrosis of skin: Secondary | ICD-10-CM

## 2021-08-24 DIAGNOSIS — Z8249 Family history of ischemic heart disease and other diseases of the circulatory system: Secondary | ICD-10-CM

## 2021-08-24 NOTE — Progress Notes (Signed)
Preoperative Dx: Scar of back  Postoperative Dx:  same  Procedure: laser to back scar  Anesthesia: none  Description of Procedure:  Risks and complications were explained to the patient. Consent was confirmed and signed. Eye protection was placed. Time out was called and all information was confirmed to be correct. The area  area was prepped with alcohol and wiped dry. The BBL laser was set at 560 nm and 7 J/cm2. The back scar was lasered. The patient tolerated the procedure well and there were no complications. The patient is to follow up in  weeks.

## 2021-09-22 ENCOUNTER — Ambulatory Visit (INDEPENDENT_AMBULATORY_CARE_PROVIDER_SITE_OTHER): Payer: Self-pay | Admitting: Plastic Surgery

## 2021-09-22 DIAGNOSIS — L905 Scar conditions and fibrosis of skin: Secondary | ICD-10-CM

## 2021-09-22 NOTE — Progress Notes (Signed)
Preoperative Dx: scar of back x 2  Postoperative Dx:  same  Procedure: laser to back scar x 2   Anesthesia: none  Description of Procedure:  Risks and complications were explained to the patient. Consent was confirmed and signed. Eye protection was placed. Time out was called and all information was confirmed to be correct. The area  area was prepped with alcohol and wiped dry. The BBL laser was set at 560 nm and 10 J/cm2. Then the spot laser was set at 25.  The back scars were lasered. The patient tolerated the procedure well and there were no complications. The patient is to follow up in 4 weeks.

## 2021-09-28 ENCOUNTER — Other Ambulatory Visit: Payer: BC Managed Care – PPO | Admitting: Plastic Surgery

## 2021-10-29 NOTE — Progress Notes (Unsigned)
[place] Phone: [phone] Subjective:   [name], am serving as a scribe for Dr. Hulan Saas.  I'm seeing this patient by the request  of:  Zane Herald, MD  CC: Foot pain  [id number]  Elizabeth [name] is a 61 y.o. female coming in with complaint of L foot pain. Patient states that her pain is over the metatarsal heads specifically in 3rd and 4th toes. Painful to wear sandals and heels. Sneakers help her pain but she still has increase in pain if she is on her feet all day.   Also c/o pain in both ankles and she has to take IBU at night.    Patient is 2023.  Have possible stress reaction in the face of the fifth metatarsal.  Mild bursitis in the intermetatarsal webspace between the first, second and third.  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   Diverticulosis    HOH (hard of hearing)    Bilateral   Hypothyroidism    PONV (postoperative nausea and vomiting)    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   Vaginal candidiasis 07/2015   after antibiotics   Past Surgical History:  Procedure Laterality Date   AUGMENTATION MAMMAPLASTY Bilateral 2004   COLONOSCOPY  2014   HYSTEROTOMY     Robotic   LAPAROTOMY  1982   LID LESION EXCISION Left 12/22/2017   Procedure: LID LESION EXCISION with reconstruction of lower   left and right eye lids;  Surgeon: Clista Bernhardt, MD;  Location: [place];  Service: Ophthalmology;  Laterality: Left;  Frozen sections   MENISCUS REPAIR Left 1997   TONSILLECTOMY  1966   Social History   Socioeconomic History   Marital status: Married    Spouse name: Not on file   Number  of children: 3   Years of education: Not on file   Highest education level: Not on file  Occupational History   Occupation: RN  Tobacco Use   Smoking status: Never   Smokeless tobacco: Never  Vaping Use   Vaping Use: Never used  Substance and Sexual Activity   Alcohol use: Yes    Alcohol/week: 1.0 standard drink of alcohol    Types: 1 Glasses of wine per week   Drug use: No   Sexual activity: Not on file  Other Topics Concern   Not on file  Social History Narrative   Married, 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   2 sons 1 daughter all adults   07/23/2015   Social Determinants of Health   Financial Resource Strain: Not on file  Food Insecurity: Not on file  Transportation Needs: Not on file  Physical Activity: Not on file  Stress: Not on file  Social Connections: Not on file   Allergies  Allergen Reactions   Bee Venom Anaphylaxis   Codeine Nausea And Vomiting   Miconazole Itching    Monistat   Oxycodone Nausea And Vomiting   Penicillins Rash    PATIENT HAS HAD A PCN REACTION WITH IMMEDIATE RASH, :  #  #  YES  #  #  Has patient had a PCN reaction causing severe rash involving mucus membranes or skin necrosis: no Has patient had a PCN reaction that required hospitalization: no Has patient had a PCN reaction occurring within the last 10 years: no If all of the above answers are "NO", then may proceed with Cephalosporin use.    Family History  Problem Relation Age of Onset   Arthritis Mother    Hypertension Father    Diabetes Father    Heart disease Father    Breast cancer Sister     Current Outpatient Medications (Endocrine & Metabolic):    estradiol (VIVELLE-DOT) 0.075 MG/24HR, Place onto the skin. Place 1 patch onto the skin 2 times a week.   insulin aspart (NOVOLOG) 100 UNIT/ML FlexPen, Inject 2 Units into the skin daily with supper.   insulin degludec (TRESIBA) 100 UNIT/ML FlexTouch Pen, Inject 10 Units into the skin every evening.    levothyroxine (SYNTHROID)  50 MCG tablet, Take 50 tablets by mouth daily.   progesterone (PROMETRIUM) 200 MG capsule, Take 200 mg by mouth at bedtime.      Current Outpatient Medications (Other):    Multiple Vitamins-Minerals (MULTIVITAMIN WITH MINERALS) tablet, Take 1 tablet by mouth daily.   Reviewed prior external information including notes and imaging from  primary care provider As well as notes that were available from care everywhere and other healthcare systems.  Past medical history, social, surgical and family history all reviewed in electronic medical record.  No pertanent information unless stated regarding to the chief complaint.   Review of Systems:  No headache, visual changes, nausea, vomiting, diarrhea, constipation, dizziness, abdominal pain, skin rash, fevers, chills, night sweats, weight loss, swollen lymph nodes, body aches, joint swelling, chest pain, shortness of breath, mood changes. POSITIVE muscle aches  Objective  Last menstrual period 08/03/2010.   General: No apparent distress alert and oriented x3 mood and affect normal, dressed appropriately.  HEENT: Pupils equal, extraocular movements intact  Respiratory: Patient's speak in full sentences and does not appear short of breath  Cardiovascular: No lower extremity edema, non tender, no erythema  Foot exam this is longitudinal and transverse arch noted.  Patient does have positive squeeze test noted.  Tender to palpation between the third and fourth and fourth and fifth metatarsal heads nontender over the base of the fifth metatarsal at this point  Procedure: Real-time Ultrasound Guided Injection of left foot neuroma between the third and fourth and fourth and fifth metatarsal heads Device: GE Logiq Q7 Ultrasound guided injection is preferred based studies that show increased duration, increased effect, greater accuracy, decreased procedural pain, increased response rate, and decreased cost with ultrasound guided versus blind injection.   Verbal informed consent obtained.  Time-out conducted.  Noted no overlying erythema, induration, or other signs of local infection.  Skin prepped in a sterile fashion.  Local anesthesia: Topical Ethyl chloride.  With sterile technique and under real time ultrasound guidance: With a 25-gauge half inch needle injected with 0.5 cc of 0.5% Marcaine and 0.5 cc of Kenalog 40 mg/mL into the neural sheath.  This was repeated at the same dose between the fourth and fifth metatarsal heads as well as the neural sheath. Completed without difficulty  Pain immediately resolved suggesting accurate placement of the medication.  Advised to call if fevers/chills, erythema, induration, drainage, or persistent bleeding.  Impression: Technically successful ultrasound guided injections.    Impression and Recommendations:     The above documentation has been reviewed and is accurate and complete  Lyndal Pulley, DO

## 2021-11-02 ENCOUNTER — Ambulatory Visit: Payer: Self-pay

## 2021-11-02 ENCOUNTER — Encounter: Payer: Self-pay | Admitting: Family Medicine

## 2021-11-02 ENCOUNTER — Ambulatory Visit (INDEPENDENT_AMBULATORY_CARE_PROVIDER_SITE_OTHER): Payer: BC Managed Care – PPO | Admitting: Family Medicine

## 2021-11-02 VITALS — BP 132/84 | HR 71 | Ht 62.0 in | Wt 118.0 lb

## 2021-11-02 DIAGNOSIS — M79672 Pain in left foot: Secondary | ICD-10-CM | POA: Diagnosis not present

## 2021-11-02 DIAGNOSIS — D3613 Benign neoplasm of peripheral nerves and autonomic nervous system of lower limb, including hip: Secondary | ICD-10-CM | POA: Insufficient documentation

## 2021-11-02 MED ORDER — DULOXETINE HCL 20 MG PO CPEP: 20.0000 mg | ORAL_CAPSULE | Freq: Every day | ORAL | 0 refills | Status: DC

## 2021-11-02 NOTE — Patient Instructions (Addendum)
Injected 2 neuromas today Spenco Total Support Orthotics OOFOS and HOKA recovery sandals Cymbalta '20mg'$  See me in 6 weeks

## 2021-11-02 NOTE — Assessment & Plan Note (Signed)
Patient was injected today.  Secondary to the transverse arch.  Discussed metatarsal support, home exercises to work on strengthening.  Started on duloxetine as well at 20 mg.  Patient is a diabetic type I with patient having bilateral ankle pain at night concern for early peripheral neuropathy.  I do think that this will be beneficial and follow-up with me again in 6 weeks otherwise.

## 2021-11-15 ENCOUNTER — Ambulatory Visit: Payer: BC Managed Care – PPO | Admitting: Podiatry

## 2021-11-25 DIAGNOSIS — R922 Inconclusive mammogram: Secondary | ICD-10-CM | POA: Diagnosis not present

## 2021-11-25 DIAGNOSIS — Z01419 Encounter for gynecological examination (general) (routine) without abnormal findings: Secondary | ICD-10-CM | POA: Diagnosis not present

## 2021-11-25 DIAGNOSIS — Z8262 Family history of osteoporosis: Secondary | ICD-10-CM | POA: Diagnosis not present

## 2021-11-25 DIAGNOSIS — N951 Menopausal and female climacteric states: Secondary | ICD-10-CM | POA: Diagnosis not present

## 2021-12-01 DIAGNOSIS — E039 Hypothyroidism, unspecified: Secondary | ICD-10-CM | POA: Diagnosis not present

## 2021-12-01 DIAGNOSIS — N951 Menopausal and female climacteric states: Secondary | ICD-10-CM | POA: Diagnosis not present

## 2021-12-15 ENCOUNTER — Ambulatory Visit: Payer: BC Managed Care – PPO | Admitting: Family Medicine

## 2021-12-21 NOTE — Progress Notes (Unsigned)
[place] Phone: [phone] Subjective:   Elizabeth [name], am serving as a scribe for Dr. Hulan [name].  I'm seeing this patient by the request  of:  Elizabeth Herald, MD  CC: left foot pain   [id number]  11/02/2021 Patient was injected today.  Secondary to the transverse arch.  Discussed metatarsal support, home exercises to work on strengthening.  Started on duloxetine as well at 20 mg.  Patient is a diabetic type I with patient having bilateral ankle pain at night concern for early peripheral neuropathy.  I do think that this will be beneficial and follow-up with me again in 6 weeks otherwise.  Updated 12/22/2021 Elizabeth [name] is a 61 y.o. female coming in with complaint of L foot pain, was seen previously and did have more of a neuroma of the foot.  Was started on a low-dose of duloxetine as well. Injection allevaited her pain. Also got OOFOS and Spenco inserts. If she walks barefoot she will have pain.        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   Diverticulosis    HOH (hard of hearing)    Bilateral   Hypothyroidism    PONV (postoperative nausea and vomiting)    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   Vaginal candidiasis 07/2015   after antibiotics   Past Surgical History:  Procedure Laterality Date   AUGMENTATION MAMMAPLASTY Bilateral 2004   COLONOSCOPY  2014   HYSTEROTOMY     Robotic   LAPAROTOMY  1982   LID LESION EXCISION Left 12/22/2017   Procedure: LID LESION EXCISION with reconstruction of lower   left and right eye lids;  Surgeon: Elizabeth Bernhardt, MD;  Location: [facility];  Service: Ophthalmology;  Laterality: Left;  Frozen sections   MENISCUS  REPAIR Left 1997   TONSILLECTOMY  1966   Social History   Socioeconomic History   Marital status: Married    Spouse name: Not on file   Number of children: 3   Years of education: Not on file   Highest education level: Not on file  Occupational History   Occupation: RN  Tobacco Use   Smoking status: Never   Smokeless tobacco: Never  Vaping Use   Vaping Use: Never used  Substance and Sexual Activity   Alcohol use: Yes    Alcohol/week: 1.0 standard drink of alcohol    Types: 1 Glasses of wine per week   Drug use: [name]   Sexual activity: Not on file  Other Topics Concern   Not on file  Social History Narrative   Married, 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   2 sons 1 daughter all adults   07/23/2015   Social Determinants of Health   Financial Resource Strain: Not on file  Food Insecurity: Not on file  Transportation Needs: Not on file  Physical Activity: Not on file  Stress: Not on file  Social Connections: Not on file   Allergies  Allergen Reactions   Bee Venom Anaphylaxis   Codeine Nausea And Vomiting   Miconazole Itching    Monistat   Oxycodone Nausea And Vomiting   Penicillins Rash  PATIENT HAS HAD A PCN REACTION WITH IMMEDIATE RASH, :  #  #  YES  #  #  Has patient had a PCN reaction causing severe rash involving mucus membranes or skin necrosis: [name] Has patient had a PCN reaction that required hospitalization: [name] Has patient had a PCN reaction occurring within the last 10 years: [name] If all of the above answers are "[name]", then may proceed with Cephalosporin use.    Family History  Problem Relation Age of Onset   Arthritis Mother    Hypertension Father    Diabetes Father    Heart disease Father    Breast cancer Sister     Current Outpatient Medications (Endocrine & Metabolic):    estradiol (VIVELLE-DOT) 0.075 MG/24HR, Place onto the skin. Place 1 patch onto the skin 2 times a week.   insulin aspart (NOVOLOG) 100 UNIT/ML FlexPen, Inject 2 Units into the skin  daily with supper.   insulin degludec (TRESIBA) 100 UNIT/ML FlexTouch Pen, Inject 10 Units into the skin every evening.    levothyroxine (SYNTHROID) 50 MCG tablet, Take 50 tablets by mouth daily.   progesterone (PROMETRIUM) 200 MG capsule, Take 200 mg by mouth at bedtime.      Current Outpatient Medications (Other):    DULoxetine (CYMBALTA) 20 MG capsule, Take 1 capsule (20 mg total) by mouth daily.   Multiple Vitamins-Minerals (MULTIVITAMIN WITH MINERALS) tablet, Take 1 tablet by mouth daily.    Review of Systems:  [name] headache, visual changes, nausea, vomiting, diarrhea, constipation, dizziness, abdominal pain, skin rash, fevers, chills, night sweats, weight loss, swollen lymph nodes, body aches, joint swelling, chest pain, shortness of breath, mood changes. POSITIVE muscle aches  Objective  Blood pressure (!) 142/84, pulse 78, height '5\' 2"'$  (1.575 m), weight 121 lb (54.9 kg), last menstrual period 08/03/2010, SpO2 99 %.   General: [name] apparent distress alert and oriented x3 mood and affect normal, dressed appropriately.  HEENT: Pupils equal, extraocular movements intact  Respiratory: Patient's speak in full sentences and does not appear short of breath  Cardiovascular: [name] lower extremity edema, non tender, [name] erythema  Foot [name] pain with compression.      Impression and Recommendations:     The above documentation has been reviewed and is accurate and complete Elizabeth Pulley, DO

## 2021-12-22 ENCOUNTER — Ambulatory Visit: Payer: Self-pay

## 2021-12-22 ENCOUNTER — Ambulatory Visit (INDEPENDENT_AMBULATORY_CARE_PROVIDER_SITE_OTHER): Payer: BC Managed Care – PPO | Admitting: Family Medicine

## 2021-12-22 VITALS — BP 142/84 | HR 78 | Ht 62.0 in | Wt 121.0 lb

## 2021-12-22 DIAGNOSIS — M79672 Pain in left foot: Secondary | ICD-10-CM | POA: Diagnosis not present

## 2021-12-22 DIAGNOSIS — M94262 Chondromalacia, left knee: Secondary | ICD-10-CM

## 2021-12-22 DIAGNOSIS — D3613 Benign neoplasm of peripheral nerves and autonomic nervous system of lower limb, including hip: Secondary | ICD-10-CM

## 2021-12-22 NOTE — Patient Instructions (Addendum)
Good to see you Schedule PRP as needed

## 2021-12-22 NOTE — Assessment & Plan Note (Signed)
Gust potential PRP in the near future.

## 2021-12-22 NOTE — Assessment & Plan Note (Signed)
Good after injection

## 2021-12-29 NOTE — Progress Notes (Signed)
[place] Phone: [phone] Subjective:   [name], am serving as a scribe for Dr. Hulan Saas.  I'm seeing this patient by the request  of:  Zane Herald, MD  CC: Left knee pain  [id number]  Elizabeth [name] is a 61 y.o. female coming in with complaint of L knee pain.  Known grade IV chondromalacia.  Here for PRP.  We will do leukocyte poor.      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   Diverticulosis    HOH (hard of hearing)    Bilateral   Hypothyroidism    PONV (postoperative nausea and vomiting)    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   Vaginal candidiasis 07/2015   after antibiotics   Past Surgical History:  Procedure Laterality Date   AUGMENTATION MAMMAPLASTY Bilateral 2004   COLONOSCOPY  2014   HYSTEROTOMY     Robotic   LAPAROTOMY  1982   LID LESION EXCISION Left 12/22/2017   Procedure: LID LESION EXCISION with reconstruction of lower   left and right eye lids;  Surgeon: Clista Bernhardt, MD;  Location: [facility];  Service: Ophthalmology;  Laterality: Left;  Frozen sections   MENISCUS REPAIR Left 1997   TONSILLECTOMY  1966   Social History   Socioeconomic History   Marital status: Married    Spouse name: Not on file   Number of children: 3   Years of education: Not on file   Highest education level: Not on file  Occupational History   Occupation: RN  Tobacco Use   Smoking status: Never   Smokeless tobacco: Never  Vaping Use   Vaping Use: Never used  Substance and Sexual Activity   Alcohol use: Yes    Alcohol/week: 1.0 standard drink of alcohol    Types: 1 Glasses of wine per week   Drug use: No   Sexual activity: Not on file   Other Topics Concern   Not on file  Social History Narrative   Married, 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   2 sons 1 daughter all adults   07/23/2015   Social Determinants of Health   Financial Resource Strain: Not on file  Food Insecurity: Not on file  Transportation Needs: Not on file  Physical Activity: Not on file  Stress: Not on file  Social Connections: Not on file   Allergies  Allergen Reactions   Bee Venom Anaphylaxis   Codeine Nausea And Vomiting   Miconazole Itching    Monistat   Oxycodone Nausea And Vomiting   Penicillins Rash    PATIENT HAS HAD A PCN REACTION WITH IMMEDIATE RASH, :  #  #  YES  #  #  Has patient had a PCN reaction causing severe rash involving mucus membranes or skin necrosis: no Has patient had a PCN reaction that required hospitalization: no Has patient had a PCN reaction occurring within the last 10 years: no If all of the above answers are "NO", then may proceed with Cephalosporin use.    Family History  Problem Relation Age of Onset   Arthritis Mother    Hypertension Father    Diabetes  Father    Heart disease Father    Breast cancer Sister     Current Outpatient Medications (Endocrine & Metabolic):    estradiol (VIVELLE-DOT) 0.075 MG/24HR, Place onto the skin. Place 1 patch onto the skin 2 times a week.   insulin aspart (NOVOLOG) 100 UNIT/ML FlexPen, Inject 2 Units into the skin daily with supper.   insulin degludec (TRESIBA) 100 UNIT/ML FlexTouch Pen, Inject 10 Units into the skin every evening.    levothyroxine (SYNTHROID) 50 MCG tablet, Take 50 tablets by mouth daily.   progesterone (PROMETRIUM) 200 MG capsule, Take 200 mg by mouth at bedtime.      Current Outpatient Medications (Other):    DULoxetine (CYMBALTA) 20 MG capsule, Take 1 capsule (20 mg total) by mouth daily.   Multiple Vitamins-Minerals (MULTIVITAMIN WITH MINERALS) tablet, Take 1 tablet by mouth daily.    Review of Systems:  No headache, visual changes, nausea,  vomiting, diarrhea, constipation, dizziness, abdominal pain, skin rash, fevers, chills, night sweats, weight loss, swollen lymph nodes, body aches, joint swelling, chest pain, shortness of breath, mood changes. POSITIVE muscle aches  Objective  Blood pressure 132/72, pulse 72, height '5\' 2"'$  (1.575 m), weight 121 lb (54.9 kg), last menstrual period 08/03/2010, SpO2 98 %.   General: No apparent distress alert and oriented x3 mood and affect normal, dressed appropriately.   After informed written and verbal consent, patient was seated on exam table. Left knee was prepped with alcohol swab and utilizing anterolateral approach, patient's left knee space was injected with 2 cc of 0.5% Marcaine with a 21-gauge 2 inch needle and then injected with 5 cc of PRP.  Patient tolerated the procedure well without immediate complications.    Impression and Recommendations:     The above documentation has been reviewed and is accurate and complete Lyndal Pulley, DO

## 2021-12-30 ENCOUNTER — Encounter: Payer: Self-pay | Admitting: Family Medicine

## 2021-12-30 ENCOUNTER — Ambulatory Visit (INDEPENDENT_AMBULATORY_CARE_PROVIDER_SITE_OTHER): Payer: Self-pay | Admitting: Family Medicine

## 2021-12-30 DIAGNOSIS — M94262 Chondromalacia, left knee: Secondary | ICD-10-CM

## 2021-12-30 NOTE — Patient Instructions (Addendum)
No ice or IBU for 3 days Heat and Tylenol are ok See me again in 6 weeks 

## 2021-12-30 NOTE — Assessment & Plan Note (Signed)
Patient given PRP injection.  Post PRP progression and protocol given.  Follow-up with me again in 6 to 8 weeks

## 2022-01-11 DIAGNOSIS — E063 Autoimmune thyroiditis: Secondary | ICD-10-CM | POA: Diagnosis not present

## 2022-01-11 DIAGNOSIS — E038 Other specified hypothyroidism: Secondary | ICD-10-CM | POA: Diagnosis not present

## 2022-01-11 DIAGNOSIS — E109 Type 1 diabetes mellitus without complications: Secondary | ICD-10-CM | POA: Diagnosis not present

## 2022-01-12 ENCOUNTER — Ambulatory Visit (AMBULATORY_SURGERY_CENTER): Payer: Self-pay | Admitting: *Deleted

## 2022-01-12 VITALS — Ht 62.0 in | Wt 120.2 lb

## 2022-01-12 DIAGNOSIS — Z1211 Encounter for screening for malignant neoplasm of colon: Secondary | ICD-10-CM

## 2022-01-12 MED ORDER — NA SULFATE-K SULFATE-MG SULF 17.5-3.13-1.6 GM/177ML PO SOLN: 1.0000 | Freq: Once | ORAL | 0 refills | Status: AC

## 2022-01-12 MED ORDER — ONDANSETRON HCL 4 MG PO TABS: ORAL_TABLET | ORAL | 0 refills | Status: DC

## 2022-01-12 NOTE — Progress Notes (Signed)
No egg or soy allergy known to patient  No issues known to pt with past sedation with any surgeries or procedures Patient denies ever being told they had issues or difficulty with intubation  No FH of Malignant Hyperthermia Pt is not on diet pills Pt is not on  home 02  Pt is not on blood thinners  Pt denies issues with constipation  No A fib or A flutter Have any cardiac testing pending--no Pt instructed to use Singlecare.com or GoodRx for a price reduction on prep    Zofran given to take prior to each prep dose.

## 2022-01-18 ENCOUNTER — Encounter: Payer: Self-pay | Admitting: Gastroenterology

## 2022-01-25 ENCOUNTER — Ambulatory Visit (AMBULATORY_SURGERY_CENTER): Payer: BC Managed Care – PPO | Admitting: Gastroenterology

## 2022-01-25 ENCOUNTER — Encounter: Payer: Self-pay | Admitting: Gastroenterology

## 2022-01-25 VITALS — BP 155/75 | HR 62 | Temp 98.3°F | Resp 8 | Ht 62.0 in | Wt 120.2 lb

## 2022-01-25 DIAGNOSIS — Z1211 Encounter for screening for malignant neoplasm of colon: Secondary | ICD-10-CM | POA: Diagnosis not present

## 2022-01-25 DIAGNOSIS — K6289 Other specified diseases of anus and rectum: Secondary | ICD-10-CM

## 2022-01-25 DIAGNOSIS — K635 Polyp of colon: Secondary | ICD-10-CM

## 2022-01-25 DIAGNOSIS — D122 Benign neoplasm of ascending colon: Secondary | ICD-10-CM

## 2022-01-25 DIAGNOSIS — K6282 Dysplasia of anus: Secondary | ICD-10-CM | POA: Diagnosis not present

## 2022-01-25 MED ORDER — HYDROCORTISONE ACETATE 25 MG RE SUPP: 25.0000 mg | Freq: Every day | RECTAL | 0 refills | Status: AC

## 2022-01-25 MED ORDER — SODIUM CHLORIDE 0.9 % IV SOLN: 500.0000 mL | Freq: Once | INTRAVENOUS | Status: DC

## 2022-01-25 NOTE — Progress Notes (Signed)
Vss nad trans to pacu °

## 2022-01-25 NOTE — Patient Instructions (Signed)
HANDOUTS PROVIDED ON: POLYPS, DIVERTICULOSIS, & HEMORRHOIDS  The polyps removed/biopsies taken today have been sent for pathology.  The results can take 1-3 weeks to receive.  When your next colonoscopy should occur will be based on the pathology results.    You may resume your previous diet and medication schedule.  Please use 2 tsp of Benefiber by mouth twice a day.  Also apply 1 25 mg suppository per rectum once daily before bedtime for one week.  Thank you for allowing [place] to care for you today!!!   YOU HAD AN ENDOSCOPIC PROCEDURE TODAY AT [facility]:   Refer to the procedure report that was given to you for any specific questions about what was found during the examination.  If the procedure report does not answer your questions, please call your gastroenterologist to clarify.  If you requested that your care partner not be given the details of your procedure findings, then the procedure report has been included in a sealed envelope for you to review at your convenience later.  YOU SHOULD EXPECT: Some feelings of bloating in the abdomen. Passage of more gas than usual.  Walking can help get rid of the air that was put into your GI tract during the procedure and reduce the bloating. If you had a lower endoscopy (such as a colonoscopy or flexible sigmoidoscopy) you may notice spotting of blood in your stool or on the toilet paper. If you underwent a bowel prep for your procedure, you may not have a normal bowel movement for a few days.  Please Note:  You might notice some irritation and congestion in your nose or some drainage.  This is from the oxygen used during your procedure.  There is no need for concern and it should clear up in a day or so.  SYMPTOMS TO REPORT IMMEDIATELY:  Following lower endoscopy (colonoscopy or flexible sigmoidoscopy):  Excessive amounts of blood in the stool  Significant tenderness or worsening of abdominal pains  Swelling of the abdomen that is  new, acute  Fever of 100F or higher  For urgent or emergent issues, a gastroenterologist can be reached at any hour by calling [phone]. Do not use MyChart messaging for urgent concerns.    DIET:  We do recommend a small meal at first, but then you may proceed to your regular diet.  Drink plenty of fluids but you should avoid alcoholic beverages for 24 hours.  ACTIVITY:  You should plan to take it easy for the rest of today and you should NOT DRIVE or use heavy machinery until tomorrow (because of the sedation medicines used during the test).    FOLLOW UP: Our staff will call the number listed on your records the next business day following your procedure.  We will call around 7:15- 8:00 am to check on you and address any questions or concerns that you may have regarding the information given to you following your procedure. If we do not reach you, we will leave a message.     If any biopsies were taken you will be contacted by phone or by letter within the next 1-3 weeks.  Please call us at [phone] if you have not heard about the biopsies in 3 weeks.    SIGNATURES/CONFIDENTIALITY: You and/or your care partner have signed paperwork which will be entered into your electronic medical record.  These signatures attest to the fact that that the information above on your After Visit Summary has been reviewed  and is understood.  Full responsibility of the confidentiality of this discharge information lies with you and/or your care-partner.

## 2022-01-25 NOTE — Progress Notes (Signed)
[facility] History and Physical   Primary Care Physician:  Zane Herald, MD   Reason for Procedure:  Colorectal cancer screening  Plan:    Screening colonoscopy with possible interventions as needed     HPI: Elizabeth [name] is a very pleasant 61 y.o. female here for screening colonoscopy. Denies any nausea, vomiting, abdominal pain, melena or bright red blood per rectum  The risks and benefits as well as alternatives of endoscopic procedure(s) have been discussed and reviewed. All questions answered. The patient agrees to proceed.    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   Diverticulosis    HOH (hard of hearing)    Bilateral   Hypothyroidism    PONV (postoperative nausea and vomiting)    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   Vaginal candidiasis 07/2015   after antibiotics    Past Surgical History:  Procedure Laterality Date   AUGMENTATION MAMMAPLASTY Bilateral 2004   COLONOSCOPY  2014   HYSTEROTOMY     Robotic   LAPAROTOMY  1982   LID LESION EXCISION Left 12/22/2017   Procedure: LID LESION EXCISION with reconstruction of lower   left and right eye lids;  Surgeon: Clista Bernhardt, MD;  Location: [facility];  Service: Ophthalmology;  Laterality: Left;  Frozen sections   MENISCUS REPAIR Left [place]    Prior to Admission medications   Medication Sig Start Date End Date Taking? Authorizing Provider  Cholecalciferol 125 MCG (5000 UT) capsule Take by mouth.   Yes [provider]  estradiol (VIVELLE-DOT) 0.075 MG/24HR Place onto the skin. Place 1 patch onto the skin 2 times a week. 04/01/21  Yes [provider]  insulin degludec (TRESIBA) 100 UNIT/ML FlexTouch Pen Inject 10 Units into the skin every evening.    Yes [provider]  levothyroxine (SYNTHROID) 50 MCG tablet Take 50 tablets by mouth daily. 10/23/19  Yes [provider]  liothyronine (CYTOMEL) 5 MCG tablet Take 5 mcg by mouth daily. 12/21/21  Yes [provider]  Multiple Vitamins-Minerals (MULTIVITAMIN WITH MINERALS) tablet Take 1 tablet by mouth daily.   Yes [provider]  progesterone (PROMETRIUM) 200 MG capsule Take 200 mg by mouth at bedtime. 05/14/19  Yes [provider]  insulin aspart (NOVOLOG) 100 UNIT/ML FlexPen Inject 2 Units into the skin daily with supper.    [provider]    Current Outpatient Medications  Medication Sig Dispense Refill   Cholecalciferol 125 MCG (5000 UT) capsule Take by mouth.     estradiol (VIVELLE-DOT) 0.075 MG/24HR Place onto the skin. Place 1 patch onto the skin 2 times a week.     insulin degludec (TRESIBA) 100 UNIT/ML FlexTouch Pen Inject 10 Units into the skin every evening.      levothyroxine (SYNTHROID) 50 MCG tablet Take 50 tablets by mouth daily.     liothyronine (CYTOMEL) 5 MCG tablet Take 5 mcg by mouth daily.     Multiple Vitamins-Minerals (MULTIVITAMIN WITH MINERALS) tablet Take 1 tablet by mouth daily.     progesterone ([place])  200 MG capsule Take 200 mg by mouth at bedtime.     insulin aspart (NOVOLOG) 100 UNIT/ML FlexPen Inject 2 Units into the skin daily with supper.     Current Facility-Administered Medications  Medication Dose Route Frequency Provider Last Rate Last Admin   0.9 %  sodium chloride infusion  500 mL Intravenous Once Mauri Pole, MD        Allergies as of 01/25/2022 - Review Complete 01/25/2022  Allergen Reaction Noted   Bee venom Anaphylaxis 01/27/2014   Codeine Nausea And Vomiting 01/27/2014   Hydrocortisone Itching, Rash, and Swelling 07/12/2021   Miconazole Itching 09/24/2018    Oxycodone Nausea And Vomiting 11/07/2016   Penicillins Rash 01/27/2014    Family History  Problem Relation Age of Onset   Arthritis Mother    Hypertension Father    Diabetes Father    Heart disease Father    Breast cancer Sister    Colon cancer Neg Hx    Colon polyps Neg Hx    Crohn's disease Neg Hx    Esophageal cancer Neg Hx    Rectal cancer Neg Hx    Stomach cancer Neg Hx    Ulcerative colitis Neg Hx     Social History   Socioeconomic History   Marital status: Married    Spouse name: Not on file   Number of children: 3   Years of education: Not on file   Highest education level: Not on file  Occupational History   Occupation: RN  Tobacco Use   Smoking status: Never    Passive exposure: Never   Smokeless tobacco: Never  Vaping Use   Vaping Use: Never used  Substance and Sexual Activity   Alcohol use: Yes    Alcohol/week: 1.0 standard drink of alcohol    Types: 1 Glasses of wine per week    Comment: 5 x a week wine   Drug use: No   Sexual activity: Not on file  Other Topics Concern   Not on file  Social History Narrative   Married, 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  Physical Exam: Vital signs in last 24 hours: Blood Pressure (Abnormal) 148/59   Pulse 76   Temperature 98.3 F (36.8 C)   Height '5\' 2"'$  (1.575 m)   Weight 120 lb 3.2 oz (54.5 kg)   Last Menstrual Period 08/03/2010   Oxygen Saturation 100%   Body Mass Index 21.98 kg/m  General:   Alert, NAD Lungs:  Clear .   Heart:  Regular rate and rhythm Abdomen:  Soft, nontender and  nondistended. Neuro/Psych:  Alert and cooperative. Normal mood and affect. A and O x 3  Reviewed labs, radiology imaging, old records and pertinent past GI work up  Patient is appropriate for planned procedure(s) and anesthesia in an ambulatory setting   K. Denzil Magnuson , MD [phone]

## 2022-01-25 NOTE — Op Note (Signed)
[facility] Patient Name: Elizabeth [name] Procedure Date: 01/25/2022 10:02 AM MRN: [id number] Endoscopist: Mauri Pole , MD, [id number] Age: 61 Referring MD:  Date of Birth: 1960-12-03 Gender: Female Account #: [id number] Procedure:                Colonoscopy Indications:              Screening for colorectal malignant neoplasm Medicines:                Monitored Anesthesia Care Procedure:                Pre-Anesthesia Assessment:                           - Prior to the procedure, a History and Physical                            was performed, and patient medications and                            allergies were reviewed. The patient's tolerance of                            previous anesthesia was also reviewed. The risks                            and benefits of the procedure and the sedation                            options and risks were discussed with the patient.                            All questions were answered, and informed consent                            was obtained. Prior Anticoagulants: The patient has                            taken no anticoagulant or antiplatelet agents. ASA                            Grade Assessment: III - A patient with severe                            systemic disease. After reviewing the risks and                            benefits, the patient was deemed in satisfactory                            condition to undergo the procedure.                           After obtaining informed consent, the colonoscope  was passed under direct vision. Throughout the                            procedure, the patient's blood pressure, pulse, and                            oxygen saturations were monitored continuously. The                            Olympus PCF-H190DL (#[id number]) Colonoscope was                            introduced through the anus and advanced to the the                             terminal ileum, with identification of the                            appendiceal orifice and IC valve. The colonoscopy                            was performed without difficulty. The patient                            tolerated the procedure well. The quality of the                            bowel preparation was excellent. The terminal                            ileum, ileocecal valve, appendiceal orifice, and                            rectum were photographed. Scope In: 10:08:17 AM Scope Out: 10:32:15 AM Scope Withdrawal Time: 0 hours 17 minutes 40 seconds  Total Procedure Duration: 0 hours 23 minutes 58 seconds  Findings:                 The perianal and digital rectal examinations were                            normal.                           A 5 mm polyp was found in the appendiceal orifice.                            The polyp was flat. The polyp was removed with a                            cold snare. Resection and retrieval were complete.                           An 8 mm polyp was found in the ascending colon. The  polyp was sessile. The polyp was removed with a                            cold snare. Resection and retrieval were complete.                           Scattered medium-mouthed diverticula were found in                            the sigmoid colon and descending colon.                           A localized area of granular mucosa was found at                            the anus. The polypoid mucosa was removed with a                            cold snare. Resection and retrieval were complete.                           Non-bleeding internal hemorrhoids were found during                            retroflexion. The hemorrhoids were small. Complications:            No immediate complications. Estimated Blood Loss:     Estimated blood loss was minimal. Impression:               - One 5 mm polyp at the appendiceal orifice,                             removed with a cold snare. Resected and retrieved.                           - One 8 mm polyp in the ascending colon, removed                            with a cold snare. Resected and retrieved.                           - Diverticulosis in the sigmoid colon and in the                            descending colon.                           - Granularity at the anus.                           - Non-bleeding internal hemorrhoids. Recommendation:           - Patient has a contact number available for  emergencies. The signs and symptoms of potential                            delayed complications were discussed with the                            patient. Return to normal activities tomorrow.                            Written discharge instructions were provided to the                            patient.                           - Resume previous diet.                           - Continue present medications.                           - Await pathology results.                           - Repeat colonoscopy date to be determined after                            pending pathology results are reviewed for                            surveillance based on pathology results.                           - Use Benefiber two teaspoons PO BID.                           - Use hydrocortisone suppository 25 mg 1 per rectum                            once a day at bedtime for 1 week.                           - Recticare HC small pea size amount per rectum 3-4                            times daily as needed X 1-2 weeks Mauri Pole, MD 01/25/2022 10:39:24 AM This report has been signed electronically.

## 2022-01-26 ENCOUNTER — Telehealth: Payer: Self-pay

## 2022-01-26 NOTE — Telephone Encounter (Signed)
  Follow up Call-     01/25/2022    8:54 AM  Call back number  Post procedure Call Back phone  # talk with Elizabeth [name]  Permission to leave phone message No     Patient questions:  Do you have a fever, pain , or abdominal swelling? No. Pain Score  0 *  Have you tolerated food without any problems? Yes.    Have you been able to return to your normal activities? Yes.    Do you have any questions about your discharge instructions: Diet   No. Medications  No. Follow up visit  No.  Do you have questions or concerns about your Care? No.  Actions: * If pain score is 4 or above: No action needed, pain <4.  Talked with husband Elizabeth [name].

## 2022-01-31 ENCOUNTER — Telehealth: Payer: Self-pay

## 2022-01-31 ENCOUNTER — Other Ambulatory Visit: Payer: Self-pay

## 2022-01-31 NOTE — Telephone Encounter (Signed)
Records faxed to [facility] for asap referral. Dx "sessile serrated polyp at appendical orifice and High resolution anoscopy for AIN 2/HSIL." Patient recall entered for repeat colonoscopy in 3 years.

## 2022-02-07 DIAGNOSIS — Z1231 Encounter for screening mammogram for malignant neoplasm of breast: Secondary | ICD-10-CM | POA: Diagnosis not present

## 2022-02-07 DIAGNOSIS — K6282 Dysplasia of anus: Secondary | ICD-10-CM | POA: Diagnosis not present

## 2022-02-07 DIAGNOSIS — D121 Benign neoplasm of appendix: Secondary | ICD-10-CM | POA: Diagnosis not present

## 2022-02-09 NOTE — Progress Notes (Signed)
[place] Phone: [phone] Subjective:   [name], am serving as a scribe for Dr. Hulan Saas.  I'm seeing this patient by the request  of:  Zane Herald, MD  CC: Left knee pain  VVO:[id number]  12/30/2021 Patient given PRP injection.  Post PRP progression and protocol given.  Follow-up with me again in 6 to 8 weeks      Update 02/15/2022 Elizabeth [name] is a 61 y.o. female coming in with complaint of L knee pain. Patient states that she no longer has grinding in her knee. If walking walking uphill she will feel pain over patella. Overall, she feels much better than last visit.  Patient states she only notices some tightness noted when she goes down stairs or downhill.  Able to work out much more regularly.      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   Vaginal candidiasis 07/2015   after antibiotics   Past Surgical History:  Procedure Laterality Date   AUGMENTATION MAMMAPLASTY Bilateral 2004   COLONOSCOPY  2014   HYSTEROTOMY     Robotic   LAPAROTOMY  1982   LID LESION EXCISION Left 12/22/2017   Procedure: LID LESION EXCISION with reconstruction of lower   left and right eye lids;  Surgeon: Clista Bernhardt, MD;  Location: [place];  Service: Ophthalmology;  Laterality: Left;  Frozen sections   MENISCUS REPAIR Left 1997   TONSILLECTOMY  1966   Social History   Socioeconomic History   Marital status: Married     Spouse name: Not on file   Number of children: 3   Years of education: Not on file   Highest education level: Not on file  Occupational History   Occupation: RN  Tobacco Use   Smoking status: Never    Passive exposure: Never   Smokeless tobacco: Never  Vaping Use   Vaping Use: Never used  Substance and Sexual Activity   Alcohol use: Yes    Alcohol/week: 1.0 standard drink of alcohol    Types: 1 Glasses of wine per week    Comment: 5 x a week wine   Drug use: No   Sexual activity: Not on file  Other Topics Concern   Not on file  Social History Narrative   Married, 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   2 sons 1 daughter all adults   07/23/2015   Social Determinants of Health   Financial Resource Strain: Not on file  Food Insecurity: Not on file  Transportation Needs: Not on file  Physical Activity: Not on file  Stress: Not on file  Social Connections: Not on file   Allergies  Allergen Reactions   Bee Venom Anaphylaxis   Codeine Nausea And Vomiting   Hydrocortisone Itching, Rash and Swelling   Miconazole Itching  Monistat   Oxycodone Nausea And Vomiting   Penicillins Rash    PATIENT HAS HAD A PCN REACTION WITH IMMEDIATE RASH, :  #  #  YES  #  #  Has patient had a PCN reaction causing severe rash involving mucus membranes or skin necrosis: no Has patient had a PCN reaction that required hospitalization: no Has patient had a PCN reaction occurring within the last 10 years: no If all of the above answers are "NO", then may proceed with Cephalosporin use.    Family History  Problem Relation Age of Onset   Arthritis Mother    Hypertension Father    Diabetes Father    Heart disease Father    Breast cancer Sister    Colon cancer Neg Hx    Colon polyps Neg Hx    Crohn's disease Neg Hx    Esophageal cancer Neg Hx    Rectal cancer Neg Hx    Stomach cancer Neg Hx    Ulcerative colitis Neg Hx     Current Outpatient Medications (Endocrine & Metabolic):     estradiol (VIVELLE-DOT) 0.075 MG/24HR, Place onto the skin. Place 1 patch onto the skin 2 times a week.   insulin aspart (NOVOLOG) 100 UNIT/ML FlexPen, Inject 2 Units into the skin daily with supper.   insulin degludec (TRESIBA) 100 UNIT/ML FlexTouch Pen, Inject 10 Units into the skin every evening.    levothyroxine (SYNTHROID) 50 MCG tablet, Take 50 tablets by mouth daily.   liothyronine (CYTOMEL) 5 MCG tablet, Take 5 mcg by mouth daily.   progesterone (PROMETRIUM) 200 MG capsule, Take 200 mg by mouth at bedtime.      Current Outpatient Medications (Other):    Cholecalciferol 125 MCG (5000 UT) capsule, Take by mouth.   hydrocortisone (ANUSOL-HC) 25 MG suppository, Place 1 suppository (25 mg total) rectally daily.   Multiple Vitamins-Minerals (MULTIVITAMIN WITH MINERALS) tablet, Take 1 tablet by mouth daily.    Objective  Blood pressure 128/82, pulse 75, height '5\' 2"'$  (1.575 m), weight 120 lb (54.4 kg), last menstrual period 08/03/2010, SpO2 99 %.   General: No apparent distress alert and oriented x3 mood and affect normal, dressed appropriately.  HEENT: Pupils equal, extraocular movements intact  Respiratory: Patient's speak in full sentences and does not appear short of breath  Cardiovascular: No lower extremity edema, non tender, no erythema  Left knee exam has full range of motion noted.  Nontender on exam today.  No significant crepitus.  Very mild lateral tracking of the patella still noted.    Impression and Recommendations:     The above documentation has been reviewed and is accurate and complete Lyndal Pulley, DO

## 2022-02-16 ENCOUNTER — Ambulatory Visit: Payer: Self-pay

## 2022-02-16 ENCOUNTER — Ambulatory Visit (INDEPENDENT_AMBULATORY_CARE_PROVIDER_SITE_OTHER): Payer: BC Managed Care – PPO | Admitting: Family Medicine

## 2022-02-16 ENCOUNTER — Encounter: Payer: Self-pay | Admitting: Family Medicine

## 2022-02-16 VITALS — BP 128/82 | HR 75 | Ht 62.0 in | Wt 120.0 lb

## 2022-02-16 DIAGNOSIS — M94262 Chondromalacia, left knee: Secondary | ICD-10-CM | POA: Diagnosis not present

## 2022-02-16 DIAGNOSIS — M25562 Pain in left knee: Secondary | ICD-10-CM

## 2022-02-16 DIAGNOSIS — G8929 Other chronic pain: Secondary | ICD-10-CM | POA: Diagnosis not present

## 2022-02-16 NOTE — Assessment & Plan Note (Signed)
Responded very well to PRP discussed HEP  Discussed which activities to do and which ones to avoid

## 2022-02-23 DIAGNOSIS — K6282 Dysplasia of anus: Secondary | ICD-10-CM | POA: Diagnosis not present

## 2022-02-23 DIAGNOSIS — K635 Polyp of colon: Secondary | ICD-10-CM | POA: Diagnosis not present

## 2022-03-21 HISTORY — PX: APPENDECTOMY: SHX54

## 2022-03-22 DIAGNOSIS — J329 Chronic sinusitis, unspecified: Secondary | ICD-10-CM | POA: Diagnosis not present

## 2022-03-22 DIAGNOSIS — J342 Deviated nasal septum: Secondary | ICD-10-CM | POA: Diagnosis not present

## 2022-03-28 DIAGNOSIS — Z91030 Bee allergy status: Secondary | ICD-10-CM | POA: Diagnosis not present

## 2022-03-28 DIAGNOSIS — E039 Hypothyroidism, unspecified: Secondary | ICD-10-CM | POA: Diagnosis not present

## 2022-03-28 DIAGNOSIS — G8918 Other acute postprocedural pain: Secondary | ICD-10-CM | POA: Diagnosis not present

## 2022-03-28 DIAGNOSIS — Z885 Allergy status to narcotic agent status: Secondary | ICD-10-CM | POA: Diagnosis not present

## 2022-03-28 DIAGNOSIS — E2601 Conn's syndrome: Secondary | ICD-10-CM | POA: Diagnosis not present

## 2022-03-28 DIAGNOSIS — D120 Benign neoplasm of cecum: Secondary | ICD-10-CM | POA: Diagnosis not present

## 2022-03-28 DIAGNOSIS — Z79890 Hormone replacement therapy (postmenopausal): Secondary | ICD-10-CM | POA: Diagnosis not present

## 2022-03-28 DIAGNOSIS — E139 Other specified diabetes mellitus without complications: Secondary | ICD-10-CM | POA: Diagnosis not present

## 2022-03-28 DIAGNOSIS — Z79899 Other long term (current) drug therapy: Secondary | ICD-10-CM | POA: Diagnosis not present

## 2022-03-28 DIAGNOSIS — Z888 Allergy status to other drugs, medicaments and biological substances status: Secondary | ICD-10-CM | POA: Diagnosis not present

## 2022-03-28 DIAGNOSIS — K6282 Dysplasia of anus: Secondary | ICD-10-CM | POA: Diagnosis not present

## 2022-03-28 DIAGNOSIS — Z85828 Personal history of other malignant neoplasm of skin: Secondary | ICD-10-CM | POA: Diagnosis not present

## 2022-03-28 DIAGNOSIS — E063 Autoimmune thyroiditis: Secondary | ICD-10-CM | POA: Diagnosis not present

## 2022-03-28 DIAGNOSIS — Z794 Long term (current) use of insulin: Secondary | ICD-10-CM | POA: Diagnosis not present

## 2022-03-28 DIAGNOSIS — K635 Polyp of colon: Secondary | ICD-10-CM | POA: Diagnosis not present

## 2022-03-28 DIAGNOSIS — Z880 Allergy status to penicillin: Secondary | ICD-10-CM | POA: Diagnosis not present

## 2022-03-29 DIAGNOSIS — Z885 Allergy status to narcotic agent status: Secondary | ICD-10-CM | POA: Diagnosis not present

## 2022-03-29 DIAGNOSIS — Z79890 Hormone replacement therapy (postmenopausal): Secondary | ICD-10-CM | POA: Diagnosis not present

## 2022-03-29 DIAGNOSIS — Z880 Allergy status to penicillin: Secondary | ICD-10-CM | POA: Diagnosis not present

## 2022-03-29 DIAGNOSIS — E039 Hypothyroidism, unspecified: Secondary | ICD-10-CM | POA: Diagnosis not present

## 2022-03-29 DIAGNOSIS — K6282 Dysplasia of anus: Secondary | ICD-10-CM | POA: Diagnosis not present

## 2022-03-29 DIAGNOSIS — Z91030 Bee allergy status: Secondary | ICD-10-CM | POA: Diagnosis not present

## 2022-03-29 DIAGNOSIS — E2601 Conn's syndrome: Secondary | ICD-10-CM | POA: Diagnosis not present

## 2022-03-29 DIAGNOSIS — Z85828 Personal history of other malignant neoplasm of skin: Secondary | ICD-10-CM | POA: Diagnosis not present

## 2022-03-29 DIAGNOSIS — E063 Autoimmune thyroiditis: Secondary | ICD-10-CM | POA: Diagnosis not present

## 2022-03-29 DIAGNOSIS — D120 Benign neoplasm of cecum: Secondary | ICD-10-CM | POA: Diagnosis not present

## 2022-03-29 DIAGNOSIS — Z794 Long term (current) use of insulin: Secondary | ICD-10-CM | POA: Diagnosis not present

## 2022-03-29 DIAGNOSIS — Z79899 Other long term (current) drug therapy: Secondary | ICD-10-CM | POA: Diagnosis not present

## 2022-03-29 DIAGNOSIS — Z888 Allergy status to other drugs, medicaments and biological substances status: Secondary | ICD-10-CM | POA: Diagnosis not present

## 2022-03-29 DIAGNOSIS — E139 Other specified diabetes mellitus without complications: Secondary | ICD-10-CM | POA: Diagnosis not present

## 2022-04-14 DIAGNOSIS — J329 Chronic sinusitis, unspecified: Secondary | ICD-10-CM | POA: Diagnosis not present

## 2022-04-14 DIAGNOSIS — J342 Deviated nasal septum: Secondary | ICD-10-CM | POA: Diagnosis not present

## 2022-04-26 DIAGNOSIS — D121 Benign neoplasm of appendix: Secondary | ICD-10-CM | POA: Diagnosis not present

## 2022-04-26 DIAGNOSIS — Z1330 Encounter for screening examination for mental health and behavioral disorders, unspecified: Secondary | ICD-10-CM | POA: Diagnosis not present

## 2022-05-21 DIAGNOSIS — J111 Influenza due to unidentified influenza virus with other respiratory manifestations: Secondary | ICD-10-CM | POA: Diagnosis not present

## 2022-05-21 DIAGNOSIS — R030 Elevated blood-pressure reading, without diagnosis of hypertension: Secondary | ICD-10-CM | POA: Diagnosis not present

## 2022-05-24 DIAGNOSIS — J329 Chronic sinusitis, unspecified: Secondary | ICD-10-CM | POA: Diagnosis not present

## 2022-05-24 DIAGNOSIS — J069 Acute upper respiratory infection, unspecified: Secondary | ICD-10-CM | POA: Diagnosis not present

## 2022-06-09 DIAGNOSIS — R5383 Other fatigue: Secondary | ICD-10-CM | POA: Diagnosis not present

## 2022-06-09 DIAGNOSIS — E039 Hypothyroidism, unspecified: Secondary | ICD-10-CM | POA: Diagnosis not present

## 2022-06-09 DIAGNOSIS — H9190 Unspecified hearing loss, unspecified ear: Secondary | ICD-10-CM | POA: Diagnosis not present

## 2022-06-09 DIAGNOSIS — N951 Menopausal and female climacteric states: Secondary | ICD-10-CM | POA: Diagnosis not present

## 2022-06-09 DIAGNOSIS — J329 Chronic sinusitis, unspecified: Secondary | ICD-10-CM | POA: Diagnosis not present

## 2022-06-09 DIAGNOSIS — E109 Type 1 diabetes mellitus without complications: Secondary | ICD-10-CM | POA: Diagnosis not present

## 2022-06-13 DIAGNOSIS — E109 Type 1 diabetes mellitus without complications: Secondary | ICD-10-CM | POA: Diagnosis not present

## 2022-06-13 DIAGNOSIS — E038 Other specified hypothyroidism: Secondary | ICD-10-CM | POA: Diagnosis not present

## 2022-06-13 DIAGNOSIS — E063 Autoimmune thyroiditis: Secondary | ICD-10-CM | POA: Diagnosis not present

## 2022-06-20 HISTORY — PX: NASAL SINUS SURGERY: SHX719

## 2022-06-28 DIAGNOSIS — J329 Chronic sinusitis, unspecified: Secondary | ICD-10-CM | POA: Diagnosis not present

## 2022-07-01 DIAGNOSIS — R053 Chronic cough: Secondary | ICD-10-CM | POA: Diagnosis not present

## 2022-07-01 DIAGNOSIS — J329 Chronic sinusitis, unspecified: Secondary | ICD-10-CM | POA: Diagnosis not present

## 2022-07-13 DIAGNOSIS — J329 Chronic sinusitis, unspecified: Secondary | ICD-10-CM | POA: Diagnosis not present

## 2022-07-13 DIAGNOSIS — J342 Deviated nasal septum: Secondary | ICD-10-CM | POA: Diagnosis not present

## 2022-07-13 DIAGNOSIS — R053 Chronic cough: Secondary | ICD-10-CM | POA: Diagnosis not present

## 2022-07-13 DIAGNOSIS — J320 Chronic maxillary sinusitis: Secondary | ICD-10-CM | POA: Diagnosis not present

## 2022-07-13 DIAGNOSIS — J322 Chronic ethmoidal sinusitis: Secondary | ICD-10-CM | POA: Diagnosis not present

## 2022-07-26 DIAGNOSIS — J329 Chronic sinusitis, unspecified: Secondary | ICD-10-CM | POA: Diagnosis not present

## 2022-08-06 IMAGING — CT CT CARDIAC CORONARY ARTERY CALCIUM SCORE
2 of 3 series · 10 of 20 positions shown, 12 images · non-contrast
Comparison: None.
COMPARISON: None.
COMPARISON: None.

Addendum:
EXAM:
OVER-READ INTERPRETATION  PET-CT CHEST

The following report is an over-read performed by radiologist Dr.
does not include interpretation of cardiac or coronary anatomy or
pathology. The cardiac CT interpretation by the cardiologist is to
be attached.
CLINICAL DATA: 60F for cardiovascular disease risk stratification
Coronary Calcium Score
TECHNIQUE: A gated, non-contrast computed tomography scan of the heart was
performed using 3mm slice thickness. Axial images were analyzed on a
dedicated workstation. Calcium scoring of the coronary arteries was
performed using the Agatston method.

[Series 3: cascseq 2.0 bf37 st · axial · 0.62mm/px · z∈[-238,-138]mm · 5 of 76 slices shown, 7 images]
[im 13/76  vessel]
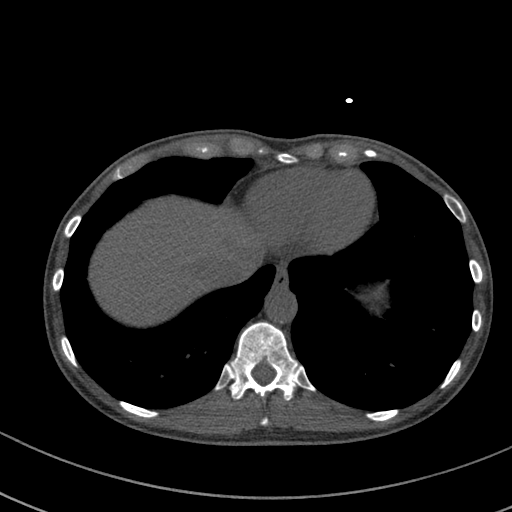
[im 13/76  lung]
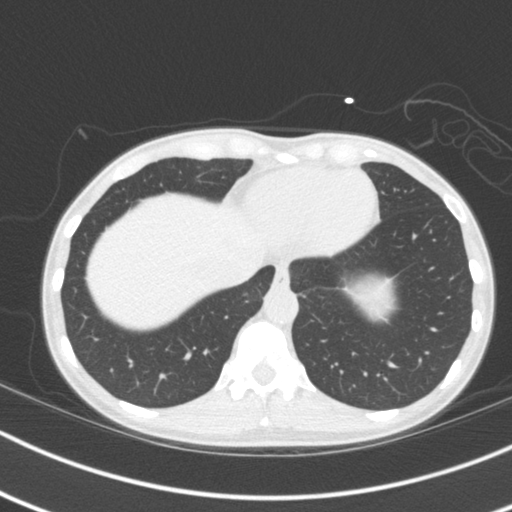
[im 26/76  vessel]
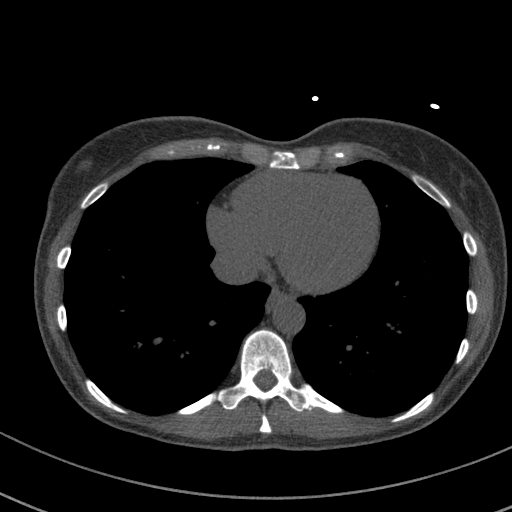
[im 38/76  vessel]
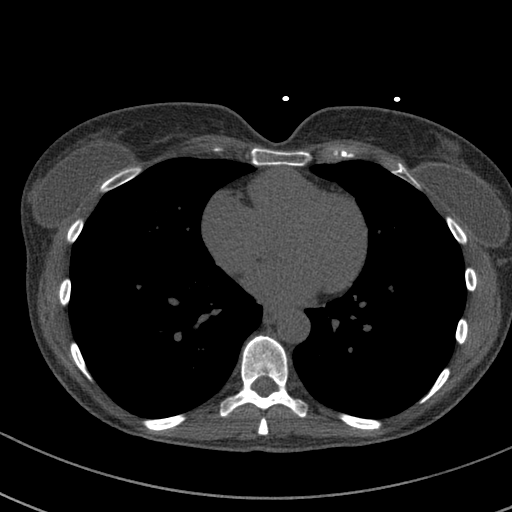
[im 51/76  vessel]
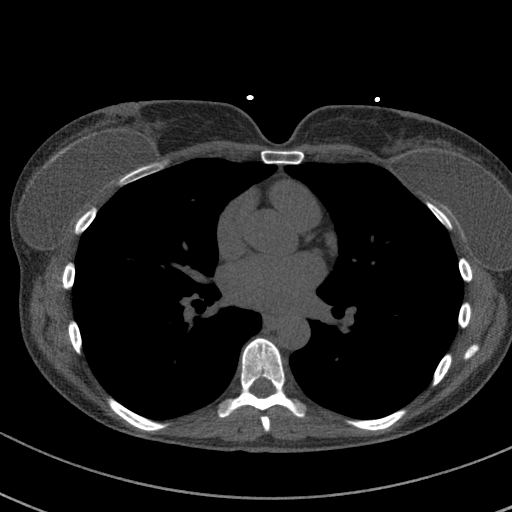
[im 63/76  vessel]
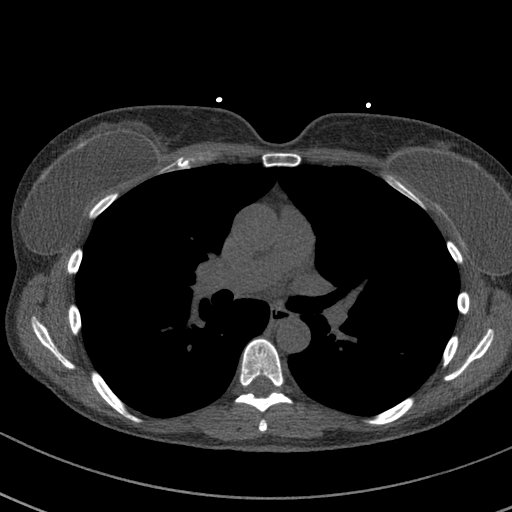
[im 63/76  lung]
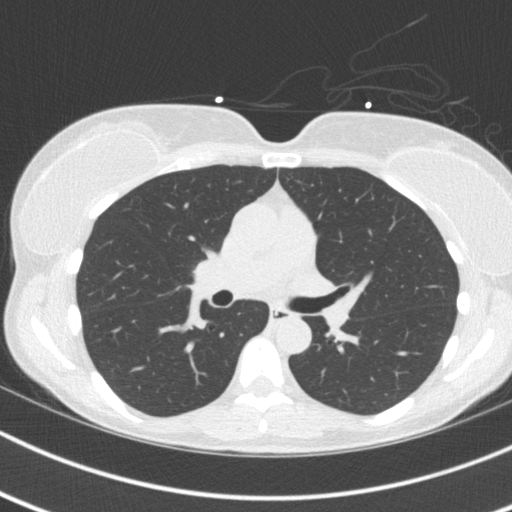

[Series 4: cascseq 2.0 br59 lung · axial · 0.62mm/px · z∈[-238,-138]mm · 5 of 76 slices shown]
[im 13/76  lung]
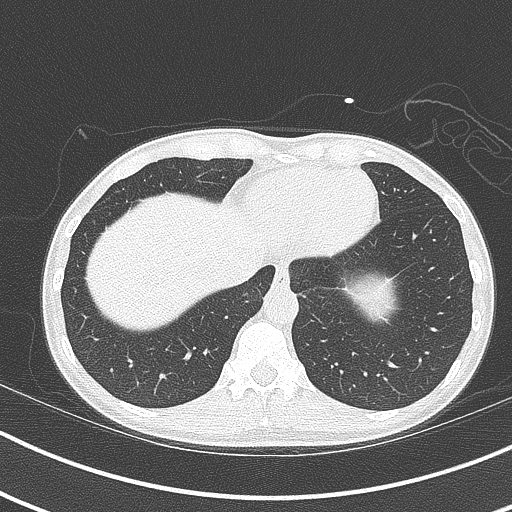
[im 26/76  lung]
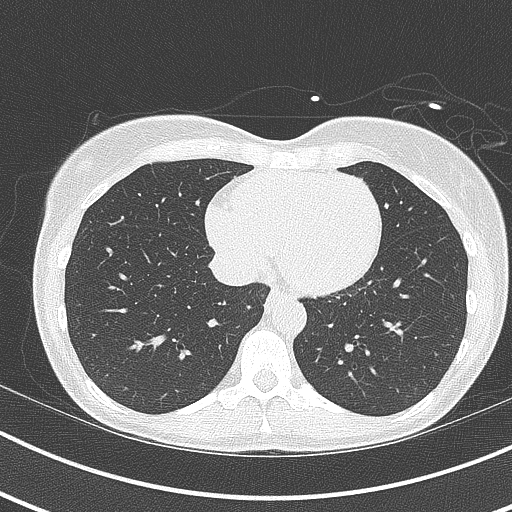
[im 38/76  lung]
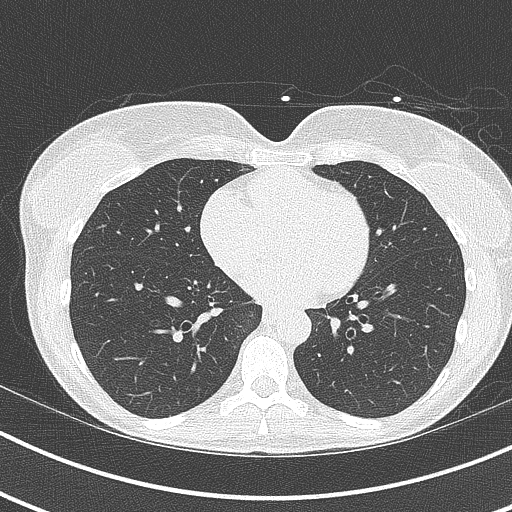
[im 51/76  lung]
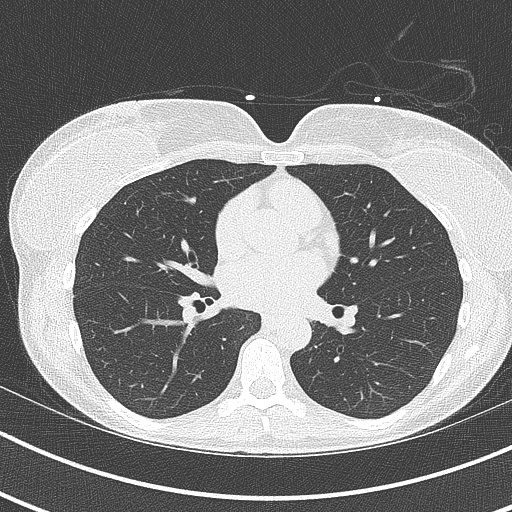
[im 63/76  lung]
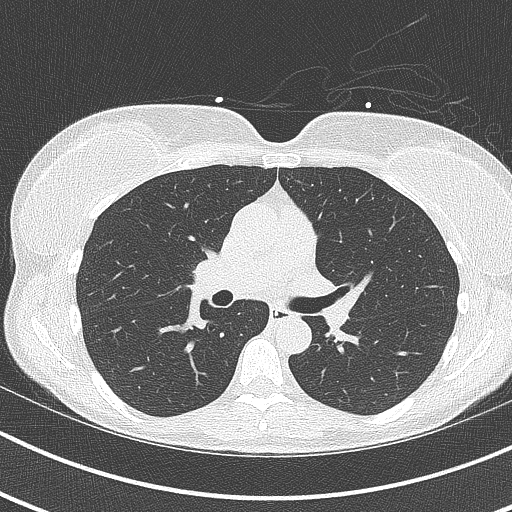

[10 of 20 positions shown; findings below may reference images not displayed]

FINDINGS: No evidence for lymphadenopathy within the visualized mediastinum or
hilar regions.

The visualized lung parenchyma shows no suspicious pulmonary nodule
or mass. No focal airspace consolidation. No effusion.

Visualized portions of the upper abdomen are unremarkable.

No suspicious lytic or sclerotic osseous abnormality.
IMPRESSION: No acute or clinically significant extracardiac findings.
FINDINGS: Coronary arteries: Normal origins.

Coronary Calcium Score:

Left main:

Left anterior descending artery: 0

Left circumflex artery: 0

Right coronary artery: 0

Total:

Percentile: 83rd

Pericardium: Normal.

Ascending Aorta: Normal caliber.  Aortic atherosclerosis.

Non-cardiac: See separate report from [REDACTED].
IMPRESSION: Coronary calcium score of 54.5. This was 83rd percentile for age-,
race-, and sex-matched controls.

Aortic atherosclerosis.



If CAC=0, it is reasonable to withhold statin therapy and reassess
in 5 to 10 years, as long as higher risk conditions are absent
(diabetes mellitus, family history of premature CHD in first degree
relatives (males <55 years; females <65 years), cigarette smoking,
or LDL >=190 mg/dL).

If CAC is 1 to 99, it is reasonable to initiate statin therapy for
patients >=55 years of age.

If CAC is >=100 or >=75th percentile, it is reasonable to initiate
statin therapy at any age.

Cardiology referral should be considered for patients with CAC
scores >=400 or >=75th percentile.

*2991 AHA/ACC/AACVPR/AAPA/ABC/FRANKI/KUDO/ROZIA/Krejca/TIGER/FRACCALVIERI/GINO
Guideline on the Management of Blood Cholesterol: A Report of the
American College of Cardiology/American Heart Association Task Force
on Clinical Practice Guidelines. J Am Coll Cardiol.
1443;73(24):7573-7988.
FINDINGS: Coronary arteries: Normal origins.

Coronary Calcium Score:

Left main:

Left anterior descending artery: 0

Left circumflex artery: 0

Right coronary artery: 0

Total:

Percentile: 83rd

Pericardium: Normal.

Ascending Aorta: Normal caliber.  Aortic atherosclerosis.

Non-cardiac: See separate report from [REDACTED].
IMPRESSION: Coronary calcium score of . This was percentile for age-, race-, and
sex-matched controls.



If CAC=0, it is reasonable to withhold statin therapy and reassess
in 5 to 10 years, as long as higher risk conditions are absent
(diabetes mellitus, family history of premature CHD in first degree
relatives (males <55 years; females <65 years), cigarette smoking,
or LDL >=190 mg/dL).

If CAC is 1 to 99, it is reasonable to initiate statin therapy for
patients >=55 years of age.

If CAC is >=100 or >=75th percentile, it is reasonable to initiate
statin therapy at any age.

Cardiology referral should be considered for patients with CAC
scores >=400 or >=75th percentile.

*2991 AHA/ACC/AACVPR/AAPA/ABC/FRANKI/KUDO/ROZIA/Krejca/TIGER/FRACCALVIERI/GINO
Guideline on the Management of Blood Cholesterol: A Report of the
American College of Cardiology/American Heart Association Task Force
on Clinical Practice Guidelines. J Am Coll Cardiol.
1443;73(24):7573-7988.

*** End of Addendum ***
Addendum:
EXAM:
OVER-READ INTERPRETATION  PET-CT CHEST

The following report is an over-read performed by radiologist Dr.
does not include interpretation of cardiac or coronary anatomy or
pathology. The cardiac CT interpretation by the cardiologist is to
be attached.
FINDINGS: No evidence for lymphadenopathy within the visualized mediastinum or
hilar regions.

The visualized lung parenchyma shows no suspicious pulmonary nodule
or mass. No focal airspace consolidation. No effusion.

Visualized portions of the upper abdomen are unremarkable.

No suspicious lytic or sclerotic osseous abnormality.
IMPRESSION: No acute or clinically significant extracardiac findings.
FINDINGS: Coronary arteries: Normal origins.

Coronary Calcium Score:

Left main:

Left anterior descending artery: 0

Left circumflex artery: 0

Right coronary artery: 0

Total:

Percentile: 83rd

Pericardium: Normal.

Ascending Aorta: Normal caliber.  Aortic atherosclerosis.

Non-cardiac: See separate report from [REDACTED].
IMPRESSION: Coronary calcium score of 54.5. This was 83rd percentile for age-,
race-, and sex-matched controls.

Aortic atherosclerosis.



If CAC=0, it is reasonable to withhold statin therapy and reassess
in 5 to 10 years, as long as higher risk conditions are absent
(diabetes mellitus, family history of premature CHD in first degree
relatives (males <55 years; females <65 years), cigarette smoking,
or LDL >=190 mg/dL).

If CAC is 1 to 99, it is reasonable to initiate statin therapy for
patients >=55 years of age.

If CAC is >=100 or >=75th percentile, it is reasonable to initiate
statin therapy at any age.

Cardiology referral should be considered for patients with CAC
scores >=400 or >=75th percentile.

*2991 AHA/ACC/AACVPR/AAPA/ABC/FRANKI/KUDO/ROZIA/Krejca/TIGER/FRACCALVIERI/GINO
Guideline on the Management of Blood Cholesterol: A Report of the
American College of Cardiology/American Heart Association Task Force
on Clinical Practice Guidelines. J Am Coll Cardiol.
1443;73(24):7573-7988.

*** End of Addendum ***
EXAM:
OVER-READ INTERPRETATION  PET-CT CHEST

The following report is an over-read performed by radiologist Dr.
does not include interpretation of cardiac or coronary anatomy or
pathology. The cardiac CT interpretation by the cardiologist is to
be attached.
FINDINGS: No evidence for lymphadenopathy within the visualized mediastinum or
hilar regions.

The visualized lung parenchyma shows no suspicious pulmonary nodule
or mass. No focal airspace consolidation. No effusion.

Visualized portions of the upper abdomen are unremarkable.

No suspicious lytic or sclerotic osseous abnormality.
IMPRESSION: No acute or clinically significant extracardiac findings.

## 2022-08-23 DIAGNOSIS — J329 Chronic sinusitis, unspecified: Secondary | ICD-10-CM | POA: Diagnosis not present

## 2022-09-20 DIAGNOSIS — J329 Chronic sinusitis, unspecified: Secondary | ICD-10-CM | POA: Diagnosis not present

## 2022-09-20 DIAGNOSIS — K219 Gastro-esophageal reflux disease without esophagitis: Secondary | ICD-10-CM | POA: Diagnosis not present

## 2022-09-20 DIAGNOSIS — H903 Sensorineural hearing loss, bilateral: Secondary | ICD-10-CM | POA: Diagnosis not present

## 2022-10-17 DIAGNOSIS — E109 Type 1 diabetes mellitus without complications: Secondary | ICD-10-CM | POA: Diagnosis not present

## 2022-10-17 DIAGNOSIS — E139 Other specified diabetes mellitus without complications: Secondary | ICD-10-CM | POA: Diagnosis not present

## 2022-10-17 DIAGNOSIS — E038 Other specified hypothyroidism: Secondary | ICD-10-CM | POA: Diagnosis not present

## 2022-10-17 DIAGNOSIS — E063 Autoimmune thyroiditis: Secondary | ICD-10-CM | POA: Diagnosis not present

## 2022-10-20 DIAGNOSIS — H903 Sensorineural hearing loss, bilateral: Secondary | ICD-10-CM | POA: Diagnosis not present

## 2022-10-24 DIAGNOSIS — R0982 Postnasal drip: Secondary | ICD-10-CM | POA: Diagnosis not present

## 2022-10-24 DIAGNOSIS — J329 Chronic sinusitis, unspecified: Secondary | ICD-10-CM | POA: Diagnosis not present

## 2022-11-11 DIAGNOSIS — K6282 Dysplasia of anus: Secondary | ICD-10-CM | POA: Diagnosis not present

## 2022-12-01 DIAGNOSIS — J329 Chronic sinusitis, unspecified: Secondary | ICD-10-CM | POA: Diagnosis not present

## 2022-12-01 DIAGNOSIS — J301 Allergic rhinitis due to pollen: Secondary | ICD-10-CM | POA: Diagnosis not present

## 2022-12-03 DIAGNOSIS — R0981 Nasal congestion: Secondary | ICD-10-CM | POA: Diagnosis not present

## 2022-12-03 DIAGNOSIS — R519 Headache, unspecified: Secondary | ICD-10-CM | POA: Diagnosis not present

## 2022-12-03 DIAGNOSIS — R5381 Other malaise: Secondary | ICD-10-CM | POA: Diagnosis not present

## 2022-12-03 DIAGNOSIS — U071 COVID-19: Secondary | ICD-10-CM | POA: Diagnosis not present

## 2022-12-03 DIAGNOSIS — R6883 Chills (without fever): Secondary | ICD-10-CM | POA: Diagnosis not present

## 2022-12-19 ENCOUNTER — Encounter: Payer: Self-pay | Admitting: Gastroenterology

## 2022-12-20 ENCOUNTER — Telehealth: Payer: Self-pay

## 2022-12-20 DIAGNOSIS — D122 Benign neoplasm of ascending colon: Secondary | ICD-10-CM

## 2022-12-20 DIAGNOSIS — K219 Gastro-esophageal reflux disease without esophagitis: Secondary | ICD-10-CM

## 2022-12-20 DIAGNOSIS — Z1211 Encounter for screening for malignant neoplasm of colon: Secondary | ICD-10-CM

## 2022-12-20 MED ORDER — POLYETHYLENE GLYCOL 3350 17 GM/SCOOP PO POWD: 1.0000 | Freq: Every day | ORAL | 0 refills | Status: DC

## 2022-12-20 NOTE — Telephone Encounter (Signed)
Per pt and husband Ruhama Lehew the pt spoke with Dr Lavon Paganini and needs an EGD and Colon set up for 10/21 in the [facility].  Instructions given to the pt over the phone and sent to My Chart.

## 2022-12-23 DIAGNOSIS — J00 Acute nasopharyngitis [common cold]: Secondary | ICD-10-CM | POA: Diagnosis not present

## 2022-12-23 DIAGNOSIS — J329 Chronic sinusitis, unspecified: Secondary | ICD-10-CM | POA: Diagnosis not present

## 2022-12-28 ENCOUNTER — Encounter: Payer: Self-pay | Admitting: Gastroenterology

## 2023-01-04 MED ORDER — SUTAB 1479-225-188 MG PO TABS: ORAL_TABLET | ORAL | 0 refills | Status: DC

## 2023-01-09 ENCOUNTER — Other Ambulatory Visit: Payer: Self-pay

## 2023-01-09 ENCOUNTER — Ambulatory Visit: Payer: BC Managed Care – PPO | Admitting: Gastroenterology

## 2023-01-09 ENCOUNTER — Encounter: Payer: Self-pay | Admitting: Gastroenterology

## 2023-01-09 VITALS — BP 162/80 | HR 72 | Temp 98.6°F | Resp 17 | Ht 62.0 in | Wt 119.8 lb

## 2023-01-09 DIAGNOSIS — Z860101 Personal history of adenomatous and serrated colon polyps: Secondary | ICD-10-CM | POA: Diagnosis not present

## 2023-01-09 DIAGNOSIS — K219 Gastro-esophageal reflux disease without esophagitis: Secondary | ICD-10-CM

## 2023-01-09 DIAGNOSIS — K6282 Dysplasia of anus: Secondary | ICD-10-CM

## 2023-01-09 DIAGNOSIS — Z09 Encounter for follow-up examination after completed treatment for conditions other than malignant neoplasm: Secondary | ICD-10-CM

## 2023-01-09 DIAGNOSIS — Z1211 Encounter for screening for malignant neoplasm of colon: Secondary | ICD-10-CM | POA: Diagnosis not present

## 2023-01-09 DIAGNOSIS — R059 Cough, unspecified: Secondary | ICD-10-CM

## 2023-01-09 DIAGNOSIS — R490 Dysphonia: Secondary | ICD-10-CM

## 2023-01-09 DIAGNOSIS — R12 Heartburn: Secondary | ICD-10-CM | POA: Diagnosis not present

## 2023-01-09 DIAGNOSIS — K2900 Acute gastritis without bleeding: Secondary | ICD-10-CM

## 2023-01-09 DIAGNOSIS — R09A2 Foreign body sensation, throat: Secondary | ICD-10-CM

## 2023-01-09 DIAGNOSIS — K2970 Gastritis, unspecified, without bleeding: Secondary | ICD-10-CM | POA: Diagnosis not present

## 2023-01-09 MED ORDER — SODIUM CHLORIDE 0.9 % IV SOLN: 500.0000 mL | Freq: Once | INTRAVENOUS | Status: DC

## 2023-01-09 NOTE — Progress Notes (Signed)
To pacu, VSS. Report to [email or web address] 

## 2023-01-09 NOTE — Patient Instructions (Signed)
Discharge instructions given. Handouts on Diverticulosis,Hemorrhoids and Gastritis. Resume previous medications. Post-op [facility]ravo pH instructions Once you get home:  Eat normally and go about your daily routine/activities Limit drinking fluids or eating between meals Do not chew gum or eat hard candy DO NOT take any antacid or anti-reflux medications during the 48-hour monitoring time, unless instructed by your physician  Recording events: Events to be recorded are:  Record using event buttons on recorder and write on paper diary form Every time you eat or drink something (other than water) 2.   Periods of lying down/reclining 3.  Symptoms:  may include heartburn, regurgitation, chest pain, cough or specify if other.  A paper diary is also provided to record the times of your reflux symptoms and times for meals and when you lie down.  The recorder needs to remain within 3 feet (arms length) of you during the testing period (48 hours). If you should forget and move outside of a 3-foot radius of the receiver you may hear beeping and you will see a "C1" error in the display window on the top of the receiver.  Please pick up the receiver and hold close to you to re-establish the connection and the error message disappears.  You may take a bath/shower during the testing period, but the recorder must not get wet and must remain within 3 feet of you. Please leave the receiver outside of the shower or tub while bathing. The monitoring period will be for 48 hours after placement of the capsule.  At the end of the 48 hours, you will return the recorder, and your diary, to our [facility] front desk.  A nurse will meet you to collect the device and answer any questions you may have.  The device should turn off once the 48 hours is complete.   What to expect after placement of the capsule:  Some patients experience a vague sensation that something is in their esophagus or that they 'feel'  the capsule when they swallow food.  Should you experience this, chewing food carefully or drinking liquids may minimize this sensation.   After the test is complete, the disposable capsule will fall off the wall of your esophagus within 5-10 days and pass naturally with your bowel movement through the digestive tract.  Once the recorder is returned, your provider will review and interpret your recordings and contact you to discuss your results.  This may take up to two weeks.   DO NOT have an MRI for 30 days after your procedure to ensure the capsule is no longer inside your body  It is imperative that you return the recorder on ___10/23/2024______________________ by 3:00pm.  Your information must be downloaded at this time to obtain your results.

## 2023-01-09 NOTE — Op Note (Signed)
[facility] Patient Name: Elizabeth [name] Procedure Date: 01/09/2023 10:02 AM MRN: [id number] Endoscopist: Napoleon Form , MD, [id number] Age: 62 Referring MD:  Date of Birth: 05/03/1960 Gender: Female Account #: [id number] Procedure:                Upper GI endoscopy Indications:              Esophageal reflux symptoms that persist despite                            appropriate therapy Medicines:                Monitored Anesthesia Care Procedure:                Pre-Anesthesia Assessment:                           - Prior to the procedure, a History and Physical                            was performed, and patient medications and                            allergies were reviewed. The patient's tolerance of                            previous anesthesia was also reviewed. The risks                            and benefits of the procedure and the sedation                            options and risks were discussed with the patient.                            All questions were answered, and informed consent                            was obtained. Prior Anticoagulants: The patient has                            taken no anticoagulant or antiplatelet agents. ASA                            Grade Assessment: II - A patient with mild systemic                            disease. After reviewing the risks and benefits,                            the patient was deemed in satisfactory condition to                            undergo the procedure.  After obtaining informed consent, the endoscope was                            passed under direct vision. Throughout the                            procedure, the patient's blood pressure, pulse, and                            oxygen saturations were monitored continuously. The                            GIF [id number] #[id number] was introduced through the                            mouth, and advanced to the  second part of duodenum.                            The upper GI endoscopy was accomplished without                            difficulty. The patient tolerated the procedure                            well. Scope In: Scope Out: Findings:                 The Z-line was regular and was found 38 cm from the                            incisors. The BRAVO capsule with delivery system                            was introduced through the mouth and advanced into                            the esophagus, such that the BRAVO pH capsule was                            positioned 33 cm from the incisors, which was 5 cm                            proximal to the GE junction. Suction was applied to                            the well of the BRAVO pH capsule to suck in the                            adjacent mucosa of the esophagus using the external                            vacuum pump set at a minimum vacuum pressure of 550  mmHg for 45 seconds. The BRAVO pH capsule was then                            deployed by depressing the plunger on top of the                            handle to advance the locking pin into the mucosa,                            thereby attaching the capsule to the esophagus. The                            plunger was then rotated a quarter turn clockwise                            to release the capsule from the delivery system.                            The delivery system was then withdrawn. Endoscopy                            was utilized for probe placement and diagnostic                            evaluation.                           The gastroesophageal flap valve was visualized                            endoscopically and classified as Hill Grade II                            (fold present, opens with respiration).                           The exam of the esophagus was otherwise normal.                           Patchy moderate  inflammation characterized by                            congestion (edema), erosions, erythema and                            friability was found in the gastric antrum and in                            the prepyloric region of the stomach. Biopsies were                            taken with a cold forceps for Helicobacter pylori  testing.                           The cardia and gastric fundus were normal on                            retroflexion.                           The examined duodenum was normal. Complications:            No immediate complications. Estimated Blood Loss:     Estimated blood loss was minimal. Impression:               - Z-line regular, 38 cm from the incisors.                           - Gastroesophageal flap valve classified as Hill                            Grade II (fold present, opens with respiration).                           - Gastritis. Biopsied.                           - Normal examined duodenum.                           - The BRAVO pH capsule was positioned 33 cm from                            the incisors, which was 5 cm proximal to the GE                            junction. Recommendation:           - Patient has a contact number available for                            emergencies. The signs and symptoms of potential                            delayed complications were discussed with the                            patient. Return to normal activities tomorrow.                            Written discharge instructions were provided to the                            patient.                           - Resume previous diet.                           -  Continue present medications.                           - Await pathology results.                           - No ibuprofen, naproxen, or other non-steroidal                            anti-inflammatory drugs.                           - F/u pH Bravo study Napoleon Form, MD 01/09/2023 11:07:01 AM This report has been signed electronically.

## 2023-01-09 NOTE — Op Note (Signed)
[facility] Patient Name: Elizabeth [name] Procedure Date: 01/09/2023 10:02 AM MRN: [id number] Endoscopist: Napoleon Form , MD, [id number] Age: 62 Referring MD:  Date of Birth: 1960/04/05 Gender: Female Account #: [id number] Procedure:                Colonoscopy Indications:              High risk colon cancer surveillance: Personal                            history of adenoma (10 mm or greater in size), High                            risk colon cancer surveillance: Personal history of                            multiple (3 or more) adenomas, appendiceal adenoma                            s/p extended appendectomy Medicines:                Monitored Anesthesia Care Procedure:                Pre-Anesthesia Assessment:                           - Prior to the procedure, a History and Physical                            was performed, and patient medications and                            allergies were reviewed. The patient's tolerance of                            previous anesthesia was also reviewed. The risks                            and benefits of the procedure and the sedation                            options and risks were discussed with the patient.                            All questions were answered, and informed consent                            was obtained. Prior Anticoagulants: The patient has                            taken no anticoagulant or antiplatelet agents. ASA                            Grade Assessment: II - A patient with mild systemic  disease. After reviewing the risks and benefits,                            the patient was deemed in satisfactory condition to                            undergo the procedure.                           After obtaining informed consent, the colonoscope                            was passed under direct vision. Throughout the                            procedure, the patient's  blood pressure, pulse, and                            oxygen saturations were monitored continuously. The                            Olympus Scope SN: [id number] was introduced through                            the anus and advanced to the the cecum, identified                            by appendiceal orifice and ileocecal valve. The                            colonoscopy was performed without difficulty. The                            patient tolerated the procedure well. The quality                            of the bowel preparation was excellent. The                            ileocecal valve, appendiceal orifice, and rectum                            were photographed. Scope In: 10:26:51 AM Scope Out: 10:48:38 AM Scope Withdrawal Time: 0 hours 14 minutes 6 seconds  Total Procedure Duration: 0 hours 21 minutes 47 seconds  Findings:                 The perianal and digital rectal examinations were                            normal.                           Scattered medium-mouthed diverticula were found in  the sigmoid colon and descending colon.                            Peri-diverticular erythema was seen. There was                            evidence of an impacted diverticulum. There was no                            evidence of diverticular bleeding.                           Non-bleeding external and internal hemorrhoids were                            found during retroflexion. The hemorrhoids were                            small. Complications:            No immediate complications. Estimated Blood Loss:     Estimated blood loss was minimal. Impression:               - Moderate diverticulosis in the sigmoid colon and                            in the descending colon. Peri-diverticular erythema                            was seen. There was evidence of an impacted                            diverticulum. There was no evidence of diverticular                             bleeding.                           - Non-bleeding external and internal hemorrhoids.                           - No specimens collected. Recommendation:           - Patient has a contact number available for                            emergencies. The signs and symptoms of potential                            delayed complications were discussed with the                            patient. Return to normal activities tomorrow.                            Written discharge instructions were provided to the  patient.                           - Resume previous diet.                           - Continue present medications.                           - Repeat colonoscopy in 5 years for surveillance. Napoleon Form, MD 01/09/2023 11:00:56 AM This report has been signed electronically.

## 2023-01-09 NOTE — Progress Notes (Addendum)
Capsule expiration date- 02/14/2024  Capsule ID number [id number]  LES measurement: 38 (capsule placed 5 cm above LES) 33  Time of implant: 1020

## 2023-01-09 NOTE — Progress Notes (Signed)
VS by DT  Pt's states no medical or surgical changes since previsit or office visit.  

## 2023-01-09 NOTE — Progress Notes (Signed)
Called to room to assist during endoscopic procedure.  Patient ID and intended procedure confirmed with present staff. Received instructions for my participation in the procedure from the performing physician.  

## 2023-01-09 NOTE — Progress Notes (Signed)
[facility] History and Physical   Primary Care Physician:  Jones Bales, MD   Reason for Procedure:  H/o colon adenomatous polyps GERD, globus sensation, hoarseness  Plan:    EGD and colonoscopy with possible interventions as needed     HPI: Elizabeth [name] is a very pleasant 62 y.o. female here for GERD, globus sensation hoarseness, h/o colon adenomatous polyps.  The risks and benefits as well as alternatives of endoscopic procedure(s) have been discussed and reviewed. All questions answered. The patient agrees to proceed.    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   Vaginal candidiasis 07/2015   after antibiotics    Past Surgical History:  Procedure Laterality Date   APPENDECTOMY  03/2022   extended appendectomy   AUGMENTATION MAMMAPLASTY Bilateral 2004   COLONOSCOPY  2014   HYSTEROTOMY     Robotic   LAPAROTOMY  1982   LID LESION EXCISION Left 12/22/2017   Procedure: LID LESION EXCISION with reconstruction of lower   left and right eye lids;  Surgeon: Floydene Flock, MD;  Location: [facility] OR;  Service: Ophthalmology;  Laterality: Left;  Frozen sections   MENISCUS REPAIR Left 1997   NASAL SINUS SURGERY  06/2022   TONSILLECTOMY  1966    Prior to Admission medications   Medication Sig Start Date End Date Taking? Authorizing Provider  Continuous Glucose Sensor (DEXCOM G7 SENSOR) MISC by Does not apply route. 04/14/22  Yes [provider]  estradiol (VIVELLE-DOT) 0.075  MG/24HR Place onto the skin. Place 1 patch onto the skin 2 times a week. 04/01/21  Yes [provider]  insulin degludec (TRESIBA) 100 UNIT/ML FlexTouch Pen Inject 10 Units into the skin every evening.    Yes [provider]  Insulin lispro (HUMALOG JUNIOR KWIKPEN) 100 UNIT/ML Inject SQ 1: 12g b-fast, 1:10g lunch, 1:10g supper and 1:12g snack plus correction 1 per 70>120 (up to 17 units daily) 01/11/22  Yes [provider]  levothyroxine (SYNTHROID) 50 MCG tablet Take 50 tablets by mouth daily. 10/23/19  Yes [provider]  liothyronine (CYTOMEL) 5 MCG tablet Take 5 mcg by mouth daily. 12/21/21  Yes [provider]  progesterone (PROMETRIUM) 200 MG capsule Take 200 mg by mouth at bedtime. 05/14/19  Yes [provider]  AUVI-Q 0.3 MG/0.3ML SOAJ injection Inject into the muscle.    [provider]  Cholecalciferol 125 MCG (5000 UT) capsule Take by mouth. Patient not taking: Reported on 01/09/2023    [provider]  glucose blood (CONTOUR NEXT TEST) test strip  09/24/19   [provider]  hydrocortisone (ANUSOL-HC) 25 MG suppository Place 1 suppository (25 mg total) rectally daily. 01/25/22   Napoleon Form, MD  insulin aspart (NOVOLOG) 100 UNIT/ML FlexPen Inject 2 Units into the skin daily with supper. Patient not taking: Reported on 01/09/2023    [provider]  Multiple Vitamins-Minerals (MULTIVITAMIN WITH MINERALS) tablet Take 1 tablet by mouth daily.    [provider]    Current Outpatient Medications  Medication Sig Dispense Refill   Continuous Glucose Sensor (DEXCOM G7 SENSOR) MISC by Does not apply route.     estradiol (VIVELLE-DOT) 0.075 MG/24HR Place onto the skin. Place 1 patch onto the skin 2 times a week.     insulin degludec (TRESIBA) 100 UNIT/ML FlexTouch Pen Inject 10 Units into the skin every evening.      Insulin lispro (HUMALOG JUNIOR KWIKPEN) 100 UNIT/ML Inject SQ 1: 12g b-fast,  1:10g lunch, 1:10g supper and 1:12g snack plus correction 1 per 70>120 (up to 17 units daily)     levothyroxine (SYNTHROID) 50 MCG tablet Take 50 tablets by mouth daily.     liothyronine (CYTOMEL) 5 MCG tablet Take 5 mcg by mouth daily.     progesterone (PROMETRIUM) 200 MG capsule Take 200 mg by mouth at bedtime.     AUVI-Q 0.3 MG/0.3ML SOAJ injection Inject into the muscle.     Cholecalciferol 125 MCG (5000 UT) capsule Take by mouth. (Patient not taking: Reported on 01/09/2023)     glucose blood (CONTOUR NEXT TEST) test strip      hydrocortisone (ANUSOL-HC) 25 MG suppository Place 1 suppository (25 mg total) rectally daily. 12 suppository 0   insulin aspart (NOVOLOG) 100 UNIT/ML FlexPen Inject 2 Units into the skin daily with supper. (Patient not taking: Reported on 01/09/2023)     Multiple Vitamins-Minerals (MULTIVITAMIN WITH MINERALS) tablet Take 1 tablet by mouth daily.     Current Facility-Administered Medications  Medication Dose Route Frequency Provider Last Rate Last Admin   0.9 %  sodium chloride infusion  500 mL Intravenous Once Napoleon Form, MD        Allergies as of 01/09/2023 - Review Complete 01/09/2023  Allergen Reaction Noted   Bee venom Anaphylaxis 01/27/2014   Codeine Nausea And Vomiting 01/27/2014   Hydrocortisone Itching, Rash, and Swelling 07/12/2021   Miconazole Itching 09/24/2018   Oxycodone Nausea And Vomiting 11/07/2016   Penicillins Rash 01/27/2014    Family History  Problem Relation Age of Onset   Arthritis Mother    Hypertension Father    Diabetes Father    Heart disease Father    Breast cancer Sister    Colon cancer Neg Hx    Colon polyps Neg Hx    Crohn's disease Neg Hx    Esophageal cancer Neg Hx    Rectal cancer Neg Hx    Stomach cancer Neg Hx    Ulcerative colitis Neg Hx     Social History   Socioeconomic History   Marital status: Married    Spouse name: Not on file   Number of children: 3   Years of education: Not on file    Highest education level: Not on file  Occupational History   Occupation: RN  Tobacco Use   Smoking status: Never    Passive exposure: Never   Smokeless tobacco: Never  Vaping Use   Vaping status: Never Used  Substance and Sexual Activity   Alcohol use: Yes    Alcohol/week: 1.0 standard drink of alcohol    Types: 1 Glasses of wine per week    Comment: 5 x a week wine   Drug use: No   Sexual activity: Not on file  Other Topics Concern   Not on file  Social History Narrative   Married, 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, research (medical)  2 sons 1 daughter all adults   07/23/2015   Social Determinants of Health   Financial Resource Strain: Low Risk  (04/23/2022)   Received from [facility], [facility]   Overall Financial Resource Strain (CARDIA)    Difficulty of Paying Living Expenses: Not hard at all  Food Insecurity: No Food Insecurity (04/23/2022)   Received from [facility], [facility]   Hunger Vital Sign    Worried About Running Out of Food in the Last Year: Never true    Ran Out of Food in the Last Year: Never true  Transportation Needs: No Transportation Needs (04/23/2022)   Received from [facility], [facility]   PRAPARE - Transportation    Lack of Transportation (Medical): No    Lack of Transportation (Non-Medical): No  Physical Activity: Sufficiently Active (04/23/2022)   Received from [facility], [facility]   Exercise Vital Sign    Days of Exercise per Week: 5 days    Minutes of Exercise per Session: 30 min  Stress: No Stress Concern Present (04/23/2022)   Received from [facility], [facility] of Occupational Health - Occupational Stress Questionnaire    Feeling of Stress : Only a little  Social Connections: Socially Integrated (04/23/2022)   Received from [facility], [facility]   Social Network    How would you rate your social network (family, work, friends)?: Good participation with social networks  Intimate Partner Violence: Not At  Risk (04/23/2022)   Received from [facility], [facility]   HITS    Over the last 12 months how often did your partner physically hurt you?: 1    Over the last 12 months how often did your partner insult you or talk down to you?: 1    Over the last 12 months how often did your partner threaten you with physical harm?: 1    Over the last 12 months how often did your partner scream or curse at you?: 1    Review of Systems:  All other review of systems negative except as mentioned in the HPI.  Physical Exam: Vital signs in last 24 hours: BP (!) 173/85   Pulse 98   Temp 98.6 F (37 C)   Resp 15   Ht 5\' 2"  (1.575 m)   Wt 119 lb 12.8 oz (54.3 kg)   LMP 08/03/2010   SpO2 100%   BMI 21.91 kg/m  General:   Alert, NAD Lungs:  Clear .   Heart:  Regular rate and rhythm Abdomen:  Soft, nontender and nondistended. Neuro/Psych:  Alert and cooperative. Normal mood and affect. A and O x 3  Reviewed labs, radiology imaging, old records and pertinent past GI work up  Patient is appropriate for planned procedure(s) and anesthesia in an ambulatory setting   K. Scherry Ran , MD [phone]

## 2023-01-10 ENCOUNTER — Telehealth: Payer: Self-pay | Admitting: *Deleted

## 2023-01-10 NOTE — Telephone Encounter (Signed)
  Follow up Call-     01/09/2023    9:33 AM 01/25/2022    8:54 AM  Call back number  Post procedure Call Back phone  # [phone] talk with Elizabeth [name]  Permission to leave phone message Yes No     Patient questions:  Do you have a fever, pain , or abdominal swelling? No. Pain Score  0 *  Have you tolerated food without any problems? Yes.    Have you been able to return to your normal activities? Yes.    Do you have any questions about your discharge instructions: Diet   No. Medications  No. Follow up visit  No.  Do you have questions or concerns about your Care? No.  Actions: * If pain score is 4 or above: No action needed, pain <4.

## 2023-01-11 DIAGNOSIS — J329 Chronic sinusitis, unspecified: Secondary | ICD-10-CM | POA: Diagnosis not present

## 2023-01-11 LAB — SURGICAL PATHOLOGY

## 2023-01-12 ENCOUNTER — Encounter: Payer: Self-pay | Admitting: Gastroenterology

## 2023-01-20 ENCOUNTER — Encounter: Payer: Self-pay | Admitting: Gastroenterology

## 2023-01-20 DIAGNOSIS — Z09 Encounter for follow-up examination after completed treatment for conditions other than malignant neoplasm: Secondary | ICD-10-CM | POA: Diagnosis not present

## 2023-01-20 DIAGNOSIS — K219 Gastro-esophageal reflux disease without esophagitis: Secondary | ICD-10-CM | POA: Diagnosis not present

## 2023-01-20 DIAGNOSIS — R490 Dysphonia: Secondary | ICD-10-CM

## 2023-01-20 DIAGNOSIS — R12 Heartburn: Secondary | ICD-10-CM | POA: Diagnosis not present

## 2023-01-23 ENCOUNTER — Encounter: Payer: Self-pay | Admitting: Gastroenterology

## 2023-02-02 DIAGNOSIS — K6282 Dysplasia of anus: Secondary | ICD-10-CM | POA: Diagnosis not present

## 2023-02-02 DIAGNOSIS — Z129 Encounter for screening for malignant neoplasm, site unspecified: Secondary | ICD-10-CM | POA: Diagnosis not present

## 2023-02-20 ENCOUNTER — Telehealth: Payer: Self-pay | Admitting: Gastroenterology

## 2023-02-20 ENCOUNTER — Encounter: Payer: Self-pay | Admitting: Gastroenterology

## 2023-02-20 DIAGNOSIS — R1310 Dysphagia, unspecified: Secondary | ICD-10-CM | POA: Diagnosis not present

## 2023-02-20 DIAGNOSIS — R0989 Other specified symptoms and signs involving the circulatory and respiratory systems: Secondary | ICD-10-CM | POA: Diagnosis not present

## 2023-02-20 DIAGNOSIS — R059 Cough, unspecified: Secondary | ICD-10-CM | POA: Diagnosis not present

## 2023-02-20 NOTE — Telephone Encounter (Signed)
Called Elizabeth [name] back, I cannot see the report in Care Everywhere but [name] was able to read the report to me, has minor dysmotility but has no stasis of barium, no stricture or hiatal hernia or gastroesophageal reflux.  Overall reassuring.  She will try to send [place] a copy of report through MyChart and also request radiology to push the images through PACS to [facility] health.  Do not recommend additional motility testing at this point.

## 2023-02-20 NOTE — Telephone Encounter (Signed)
I cannot access this report. Can you see the images?

## 2023-02-20 NOTE — Telephone Encounter (Signed)
PT had a barium swallow today with [facility] and it was abnormal. She would like to further discuss the results. Please advise.

## 2023-02-21 NOTE — Progress Notes (Unsigned)
    Elizabeth [name] D.[facility] [address] [place] [address] Phone: [phone]   Assessment and Plan:     There are no diagnoses linked to this encounter.  ***   Pertinent previous records reviewed include ***    Follow Up: ***     Subjective:   I, Elizabeth [name], am serving as a Neurosurgeon for Doctor Richardean Sale  Chief Complaint: neck pain   HPI:   02/22/2023 Patient is a 62 year old female with neck pain. Patient states   Relevant Historical Information: ***  Additional pertinent review of systems negative.   Current Outpatient Medications:    AUVI-Q 0.3 MG/0.3ML SOAJ injection, Inject into the muscle., Disp: , Rfl:    Cholecalciferol 125 MCG (5000 UT) capsule, Take by mouth. (Patient not taking: Reported on 01/09/2023), Disp: , Rfl:    Continuous Glucose Sensor (DEXCOM G7 SENSOR) MISC, by Does not apply route., Disp: , Rfl:    estradiol (VIVELLE-DOT) 0.075 MG/24HR, Place onto the skin. Place 1 patch onto the skin 2 times a week., Disp: , Rfl:    glucose blood (CONTOUR NEXT TEST) test strip, , Disp: , Rfl:    hydrocortisone (ANUSOL-HC) 25 MG suppository, Place 1 suppository (25 mg total) rectally daily., Disp: 12 suppository, Rfl: 0   insulin aspart (NOVOLOG) 100 UNIT/ML FlexPen, Inject 2 Units into the skin daily with supper. (Patient not taking: Reported on 01/09/2023), Disp: , Rfl:    insulin degludec (TRESIBA) 100 UNIT/ML FlexTouch Pen, Inject 10 Units into the skin every evening. , Disp: , Rfl:    Insulin lispro (HUMALOG JUNIOR KWIKPEN) 100 UNIT/ML, Inject SQ 1: 12g b-fast, 1:10g lunch, 1:10g supper and 1:12g snack plus correction 1 per 70>120 (up to 17 units daily), Disp: , Rfl:    levothyroxine (SYNTHROID) 50 MCG tablet, Take 50 tablets by mouth daily., Disp: , Rfl:    liothyronine (CYTOMEL) 5 MCG tablet, Take 5 mcg by mouth daily., Disp: , Rfl:    Multiple Vitamins-Minerals (MULTIVITAMIN WITH MINERALS) tablet, Take 1  tablet by mouth daily., Disp: , Rfl:    progesterone (PROMETRIUM) 200 MG capsule, Take 200 mg by mouth at bedtime., Disp: , Rfl:    Objective:     There were no vitals filed for this visit.    There is no height or weight on file to calculate BMI.    Physical Exam:    ***   Electronically signed by:  Elizabeth [name] D.[facility] 4:29 PM 02/21/23

## 2023-02-22 ENCOUNTER — Ambulatory Visit: Payer: BC Managed Care – PPO | Admitting: Sports Medicine

## 2023-02-22 ENCOUNTER — Ambulatory Visit: Payer: BC Managed Care – PPO

## 2023-02-22 ENCOUNTER — Ambulatory Visit (INDEPENDENT_AMBULATORY_CARE_PROVIDER_SITE_OTHER): Payer: BC Managed Care – PPO

## 2023-02-22 VITALS — BP 138/80 | HR 58 | Ht 62.0 in | Wt 124.0 lb

## 2023-02-22 DIAGNOSIS — M5441 Lumbago with sciatica, right side: Secondary | ICD-10-CM | POA: Diagnosis not present

## 2023-02-22 DIAGNOSIS — L820 Inflamed seborrheic keratosis: Secondary | ICD-10-CM | POA: Diagnosis not present

## 2023-02-22 DIAGNOSIS — R1310 Dysphagia, unspecified: Secondary | ICD-10-CM

## 2023-02-22 DIAGNOSIS — G8929 Other chronic pain: Secondary | ICD-10-CM | POA: Diagnosis not present

## 2023-02-22 DIAGNOSIS — M50322 Other cervical disc degeneration at C5-C6 level: Secondary | ICD-10-CM | POA: Diagnosis not present

## 2023-02-22 DIAGNOSIS — R490 Dysphonia: Secondary | ICD-10-CM

## 2023-02-22 DIAGNOSIS — M5010 Cervical disc disorder with radiculopathy, unspecified cervical region: Secondary | ICD-10-CM

## 2023-02-22 DIAGNOSIS — R200 Anesthesia of skin: Secondary | ICD-10-CM | POA: Diagnosis not present

## 2023-02-22 DIAGNOSIS — L821 Other seborrheic keratosis: Secondary | ICD-10-CM | POA: Diagnosis not present

## 2023-02-22 DIAGNOSIS — D225 Melanocytic nevi of trunk: Secondary | ICD-10-CM | POA: Diagnosis not present

## 2023-02-22 DIAGNOSIS — M542 Cervicalgia: Secondary | ICD-10-CM

## 2023-02-22 DIAGNOSIS — R202 Paresthesia of skin: Secondary | ICD-10-CM

## 2023-02-22 DIAGNOSIS — E109 Type 1 diabetes mellitus without complications: Secondary | ICD-10-CM | POA: Diagnosis not present

## 2023-02-22 DIAGNOSIS — R053 Chronic cough: Secondary | ICD-10-CM | POA: Diagnosis not present

## 2023-02-22 MED ORDER — MELOXICAM 15 MG PO TABS: 15.0000 mg | ORAL_TABLET | Freq: Every day | ORAL | 0 refills | Status: AC

## 2023-02-22 NOTE — Patient Instructions (Addendum)
-   Start meloxicam 15 mg daily x2 weeks.  If still having pain after 2 weeks, complete 3rd-week of NSAID. May use remaining NSAID as needed once daily for pain control.  Do not to use additional over-the-counter NSAIDs (ibuprofen, naproxen, Advil, Aleve) while taking prescription NSAIDs.  May use Tylenol [phone] mg 2 to 3 times a day for breakthrough pain. Xrays on the way out  Cspine MRI Neck and low back HEP  Follow up 5 days aftr MRI to discuss results

## 2023-02-25 ENCOUNTER — Ambulatory Visit: Payer: BC Managed Care – PPO

## 2023-02-25 DIAGNOSIS — M50221 Other cervical disc displacement at C4-C5 level: Secondary | ICD-10-CM | POA: Diagnosis not present

## 2023-02-25 DIAGNOSIS — R200 Anesthesia of skin: Secondary | ICD-10-CM | POA: Diagnosis not present

## 2023-02-25 DIAGNOSIS — M50223 Other cervical disc displacement at C6-C7 level: Secondary | ICD-10-CM | POA: Diagnosis not present

## 2023-02-25 DIAGNOSIS — M542 Cervicalgia: Secondary | ICD-10-CM

## 2023-02-25 DIAGNOSIS — M5030 Other cervical disc degeneration, unspecified cervical region: Secondary | ICD-10-CM | POA: Diagnosis not present

## 2023-02-25 DIAGNOSIS — M50222 Other cervical disc displacement at C5-C6 level: Secondary | ICD-10-CM | POA: Diagnosis not present

## 2023-02-25 DIAGNOSIS — M5010 Cervical disc disorder with radiculopathy, unspecified cervical region: Secondary | ICD-10-CM

## 2023-02-25 DIAGNOSIS — R202 Paresthesia of skin: Secondary | ICD-10-CM | POA: Diagnosis not present

## 2023-03-06 ENCOUNTER — Ambulatory Visit (INDEPENDENT_AMBULATORY_CARE_PROVIDER_SITE_OTHER): Payer: BC Managed Care – PPO

## 2023-03-06 DIAGNOSIS — M48061 Spinal stenosis, lumbar region without neurogenic claudication: Secondary | ICD-10-CM | POA: Diagnosis not present

## 2023-03-06 DIAGNOSIS — H43813 Vitreous degeneration, bilateral: Secondary | ICD-10-CM | POA: Diagnosis not present

## 2023-03-06 DIAGNOSIS — M5441 Lumbago with sciatica, right side: Secondary | ICD-10-CM

## 2023-03-06 DIAGNOSIS — G8929 Other chronic pain: Secondary | ICD-10-CM

## 2023-03-06 DIAGNOSIS — E103393 Type 1 diabetes mellitus with moderate nonproliferative diabetic retinopathy without macular edema, bilateral: Secondary | ICD-10-CM | POA: Diagnosis not present

## 2023-03-06 DIAGNOSIS — M5137 Other intervertebral disc degeneration, lumbosacral region with discogenic back pain only: Secondary | ICD-10-CM | POA: Diagnosis not present

## 2023-03-06 DIAGNOSIS — M4316 Spondylolisthesis, lumbar region: Secondary | ICD-10-CM | POA: Diagnosis not present

## 2023-03-06 DIAGNOSIS — H25813 Combined forms of age-related cataract, bilateral: Secondary | ICD-10-CM | POA: Diagnosis not present

## 2023-03-06 NOTE — Progress Notes (Signed)
Elizabeth [name] D.[facility] [address] [place] [address] Phone: [phone]   Assessment and Plan:     1. Neck pain 2. Cervical disc disorder with radiculopathy of cervical region 3. Numbness and tingling of right upper extremity 4. Numbness and tingling of left upper extremity  -Chronic with exacerbation, subsequent visit - Still consistent with cervical DDD causing cervical radiculopathy into bilateral upper extremities.  Moderate improvement with course of meloxicam 15 mg daily, HEP, the patient continues to have bilateral hand numbness and tingling -Reviewed MRI imaging with patient.  DDD most pronounced C5-C7 with mild disc herniations that could potentially explain patient's ongoing symptoms.  Foraminal impingement on the right C 4-5, bilateral at C5-6, left at C6-7 - I feel patient would benefit from bilateral epidural CSI.  Bilateral C5-C6 epidural CSI ordered -Continue HEP -Start Tylenol 325-650 mg 2-3 times a day as needed for pain relief. - Discontinue daily meloxicam 15 mg daily.  May use meloxicam 15 mg daily as needed for breakthrough pain if Tylenol is insufficient.  Recommend limiting meloxicam/NSAID use to 1 time per week  5.  Carotid stenosis - C-spine x-ray showed degenerative changes and right sided carotid stenosis with radiology recommendation of further evaluation with carotid ultrasound.  Discussed this finding with patient and recommend that she follow-up with primary care for further evaluation   Pertinent previous records reviewed include C-spine x-ray 02/22/2023, C-spine MRI 02/25/2023  Follow Up:  I feel patient would benefit from bilateral epidural CSI at C5-C6.  Follow-up 2 weeks after epidural    Subjective:   I, Moenique Parris, am serving as a Neurosurgeon for Doctor Richardean Sale   Chief Complaint: neck pain    HPI:    02/22/2023 Patient is a 62 year old female with neck pain. Patient states that she has  chronic neck pain that has increased over the last week or so. Decreased ROM . Numbness and weakness to her hands. Her pain radiates down to her low back. Mortin helps with the pain. Hasn't head ache for the past 4 days but the past 2 weeks head was all over the head TTP . She has to sleep with 3 pillows and isnt able to sleep through the night . She isn't able to lift her grandchildren  03/07/2023 Patient states mobic has really helped. Still has some decreased ROM    Relevant Historical Information: DM type I  Additional pertinent review of systems negative.   Current Outpatient Medications:    AUVI-Q 0.3 MG/0.3ML SOAJ injection, Inject into the muscle., Disp: , Rfl:    Cholecalciferol 125 MCG (5000 UT) capsule, Take by mouth., Disp: , Rfl:    Continuous Glucose Sensor (DEXCOM G7 SENSOR) MISC, by Does not apply route., Disp: , Rfl:    estradiol (VIVELLE-DOT) 0.075 MG/24HR, Place onto the skin. Place 1 patch onto the skin 2 times a week., Disp: , Rfl:    glucose blood (CONTOUR NEXT TEST) test strip, , Disp: , Rfl:    insulin aspart (NOVOLOG) 100 UNIT/ML FlexPen, Inject 2 Units into the skin daily with supper., Disp: , Rfl:    insulin degludec (TRESIBA) 100 UNIT/ML FlexTouch Pen, Inject 10 Units into the skin every evening. , Disp: , Rfl:    Insulin lispro (HUMALOG JUNIOR KWIKPEN) 100 UNIT/ML, Inject SQ 1: 12g b-fast, 1:10g lunch, 1:10g supper and 1:12g snack plus correction 1 per 70>120 (up to 17 units daily), Disp: , Rfl:    levothyroxine (SYNTHROID) 50  MCG tablet, Take 50 tablets by mouth daily., Disp: , Rfl:    liothyronine (CYTOMEL) 5 MCG tablet, Take 5 mcg by mouth daily., Disp: , Rfl:    meloxicam (MOBIC) 15 MG tablet, Take 1 tablet (15 mg total) by mouth daily., Disp: 30 tablet, Rfl: 0   Multiple Vitamins-Minerals (MULTIVITAMIN WITH MINERALS) tablet, Take 1 tablet by mouth daily., Disp: , Rfl:    progesterone (PROMETRIUM) 200 MG capsule, Take 200 mg by mouth at bedtime., Disp: , Rfl:     hydrocortisone (ANUSOL-HC) 25 MG suppository, Place 1 suppository (25 mg total) rectally daily., Disp: 12 suppository, Rfl: 0   Objective:     Vitals:   03/07/23 0757  BP: 126/74  Pulse: 80  SpO2: 100%  Weight: 123 lb (55.8 kg)  Height: 5\' 2"  (1.575 m)      Body mass index is 22.5 kg/m.    Physical Exam:    Neck Exam: Cervical Spine- Posture normal Skin- normal, intact   Neuro:  Strength-   Right Left  Deltoid (C5) 5/5 5/5 Bicep/Brachioradialis (C5/6) 5/5  5/5 Wrist Extension (C6) 5/5 5/5 Tricep (C7) 5/5 5/5 Wrist Flexion (C7) 5/5 5/5 Grip (C8) 5/5 5/5 Finger Abduction (T1) 5/5 5/5   Sensation: intact to light touch in upper extremities bilaterally   Spurling's:  negative bilaterally Neck ROM: Full active ROM TTP: Cervical paraspinal NTTP: cervical spinous processes,   thoracic paraspinal, trapezius     Electronically signed by:  Elizabeth [name] D.[facility] 8:21 AM 03/07/23

## 2023-03-07 ENCOUNTER — Ambulatory Visit: Payer: BC Managed Care – PPO | Admitting: Sports Medicine

## 2023-03-07 VITALS — BP 126/74 | HR 80 | Ht 62.0 in | Wt 123.0 lb

## 2023-03-07 DIAGNOSIS — M5010 Cervical disc disorder with radiculopathy, unspecified cervical region: Secondary | ICD-10-CM | POA: Diagnosis not present

## 2023-03-07 DIAGNOSIS — R200 Anesthesia of skin: Secondary | ICD-10-CM | POA: Diagnosis not present

## 2023-03-07 DIAGNOSIS — R202 Paresthesia of skin: Secondary | ICD-10-CM

## 2023-03-07 DIAGNOSIS — I6521 Occlusion and stenosis of right carotid artery: Secondary | ICD-10-CM | POA: Diagnosis not present

## 2023-03-07 DIAGNOSIS — M542 Cervicalgia: Secondary | ICD-10-CM

## 2023-03-07 NOTE — Patient Instructions (Addendum)
Thank you for coming in. Bilat C5-6  Start Tylenol 325-650 mg 2-3 times a day as needed for pain relief.  May use meloxicam 15 mg daily as needed for breakthrough pain if Tylenol is insufficient.  Recommend limiting meloxicam/NSAID use to 1 time per week  Recommend reaching out to your primary care physician.  You can tell him that your neck x-ray showed carotid stenosis with radiology recommendation for follow-up with carotid ultrasound.  Once MRI has been read, we will order bilateral epidural steroid injections.  Their department will be reaching out to you to schedule a follow-up visit.  Once your procedure visit is scheduled, call us and schedule a follow-up with me for 2 weeks after the procedure.  Continue home exercise program

## 2023-03-09 ENCOUNTER — Other Ambulatory Visit: Payer: Self-pay | Admitting: Sports Medicine

## 2023-03-10 ENCOUNTER — Ambulatory Visit
Admission: RE | Admit: 2023-03-10 | Discharge: 2023-03-10 | Disposition: A | Discharge status: Home / Self Care | Payer: BC Managed Care – PPO | Source: Ambulatory Visit | Attending: Sports Medicine | Admitting: Sports Medicine

## 2023-03-10 DIAGNOSIS — M542 Cervicalgia: Secondary | ICD-10-CM

## 2023-03-10 DIAGNOSIS — I6521 Occlusion and stenosis of right carotid artery: Secondary | ICD-10-CM

## 2023-03-10 DIAGNOSIS — M5010 Cervical disc disorder with radiculopathy, unspecified cervical region: Secondary | ICD-10-CM

## 2023-03-10 DIAGNOSIS — R200 Anesthesia of skin: Secondary | ICD-10-CM

## 2023-03-10 DIAGNOSIS — M4722 Other spondylosis with radiculopathy, cervical region: Secondary | ICD-10-CM | POA: Diagnosis not present

## 2023-03-10 MED ORDER — TRIAMCINOLONE ACETONIDE 40 MG/ML IJ SUSP (RADIOLOGY)
60.0000 mg | Freq: Once | INTRAMUSCULAR | Status: AC
  Administered 2023-03-10: 60 mg via EPIDURAL

## 2023-03-10 MED ORDER — IOPAMIDOL (ISOVUE-M 300) INJECTION 61%
1.0000 mL | Freq: Once | INTRAMUSCULAR | Status: AC | PRN
Start: 2023-03-10 — End: 2023-03-10
  Administered 2023-03-10: 1 mL via EPIDURAL

## 2023-03-10 MED ORDER — METHYLPREDNISOLONE ACETATE 40 MG/ML INJ SUSP (RADIOLOG: 60.0000 mg | Freq: Once | INTRAMUSCULAR | Status: DC

## 2023-03-10 NOTE — Discharge Instructions (Signed)

## 2023-03-13 DIAGNOSIS — I6521 Occlusion and stenosis of right carotid artery: Secondary | ICD-10-CM | POA: Diagnosis not present

## 2023-03-13 DIAGNOSIS — I6529 Occlusion and stenosis of unspecified carotid artery: Secondary | ICD-10-CM | POA: Diagnosis not present

## 2023-03-13 DIAGNOSIS — E109 Type 1 diabetes mellitus without complications: Secondary | ICD-10-CM | POA: Diagnosis not present

## 2023-03-14 DIAGNOSIS — Z01811 Encounter for preprocedural respiratory examination: Secondary | ICD-10-CM | POA: Diagnosis not present

## 2023-03-14 DIAGNOSIS — E109 Type 1 diabetes mellitus without complications: Secondary | ICD-10-CM | POA: Diagnosis not present

## 2023-03-14 DIAGNOSIS — R053 Chronic cough: Secondary | ICD-10-CM | POA: Diagnosis not present

## 2023-03-20 DIAGNOSIS — J329 Chronic sinusitis, unspecified: Secondary | ICD-10-CM | POA: Diagnosis not present

## 2023-03-20 DIAGNOSIS — N951 Menopausal and female climacteric states: Secondary | ICD-10-CM | POA: Diagnosis not present

## 2023-03-20 DIAGNOSIS — E109 Type 1 diabetes mellitus without complications: Secondary | ICD-10-CM | POA: Diagnosis not present

## 2023-03-20 DIAGNOSIS — E039 Hypothyroidism, unspecified: Secondary | ICD-10-CM | POA: Diagnosis not present

## 2023-03-20 NOTE — Progress Notes (Signed)
 Ben Adin Lariccia D.[facility] [address] [place] [address] Phone: [phone]   Assessment and Plan:     1. Neck pain 2. Cervical disc disorder with radiculopathy of cervical region 3. Numbness and tingling of right upper extremity 4. Numbness and tingling of left upper extremity  -Chronic with exacerbation, subsequent visit - Significant, 75%, improvement in pain and radicular symptoms after epidural CSI at left C6-7.  Patient is required less medication after injection, and has improved function since injection. - May continue to use Tylenol  as needed for pain relief and meloxicam  15 mg daily for breakthrough pain - Continue HEP - Discussed that we could order repeat injection, the patient declined at this time.  Could be considered at future visit.  CSI will temporarily increase blood glucose with patient with past medical history of DM type I  Pertinent previous records reviewed include epidural procedure note 03/10/2023  Follow Up: As needed if no improvement or worsening of symptoms.  If symptoms return identically, could order repeat epidural CSI at C6-7 on left.  If patient's symptoms change, recommend follow-up in clinic for reevaluation.   Subjective:   I, Elizabeth [name], am serving as a neurosurgeon for Doctor Morene Mace   Chief Complaint: neck pain    HPI:    02/22/2023 Patient is a 62 year old female with neck pain. Patient states that she has chronic neck pain that has increased over the last week or so. Decreased ROM . Numbness and weakness to her hands. Her pain radiates down to her low back. Mortin helps with the pain. Hasn't head ache for the past 4 days but the past 2 weeks head was all over the head TTP . She has to sleep with 3 pillows and isnt able to sleep through the night . She isn't able to lift her grandchildren   03/07/2023 Patient states mobic  has really helped. Still has some decreased ROM    03/24/2023 Patient states she is improved. She hears crunching in her neck , but numbness in her hands is gone.    Relevant Historical Information: DM type I Additional pertinent review of systems negative.   Current Outpatient Medications:    AUVI-Q  0.3 MG/0.3ML SOAJ injection, Inject into the muscle., Disp: , Rfl:    Cholecalciferol 125 MCG (5000 UT) capsule, Take by mouth., Disp: , Rfl:    Continuous Glucose Sensor (DEXCOM G7 SENSOR) MISC, by Does not apply route., Disp: , Rfl:    estradiol  (VIVELLE -DOT) 0.075 MG/24HR, Place onto the skin. Place 1 patch onto the skin 2 times a week., Disp: , Rfl:    glucose blood (CONTOUR NEXT TEST) test strip, , Disp: , Rfl:    hydrocortisone  (ANUSOL -HC) 25 MG suppository, Place 1 suppository (25 mg total) rectally daily., Disp: 12 suppository, Rfl: 0   insulin  aspart (NOVOLOG) 100 UNIT/ML FlexPen, Inject 2 Units into the skin daily with supper., Disp: , Rfl:    insulin  degludec (TRESIBA) 100 UNIT/ML FlexTouch Pen, Inject 10 Units into the skin every evening. , Disp: , Rfl:    Insulin  lispro (HUMALOG JUNIOR KWIKPEN) 100 UNIT/ML, Inject SQ 1: 12g b-fast, 1:10g lunch, 1:10g supper and 1:12g snack plus correction 1 per 70>120 (up to 17 units daily), Disp: , Rfl:    levothyroxine (SYNTHROID) 50 MCG tablet, Take 50 tablets by mouth daily., Disp: , Rfl:    liothyronine (CYTOMEL) 5 MCG tablet, Take 5 mcg by mouth daily., Disp: , Rfl:    meloxicam  (  MOBIC ) 15 MG tablet, Take 1 tablet (15 mg total) by mouth daily., Disp: 30 tablet, Rfl: 0   Multiple Vitamins-Minerals (MULTIVITAMIN WITH MINERALS) tablet, Take 1 tablet by mouth daily., Disp: , Rfl:    progesterone  (PROMETRIUM ) 200 MG capsule, Take 200 mg by mouth at bedtime., Disp: , Rfl:    Objective:     Vitals:   03/24/23 0857  BP: 118/84  Pulse: 67  SpO2: 100%  Weight: 127 lb (57.6 kg)  Height: 5' 2 (1.575 m)      Body mass index is 23.23 kg/m.    Physical Exam:    Neck Exam: Cervical Spine-  Posture normal Skin- normal, intact   Neuro:  Strength-   Right Left  Deltoid (C5) 5/5 5/5 Bicep/Brachioradialis (C5/6) 5/5  5/5 Wrist Extension (C6) 5/5 5/5 Tricep (C7) 5/5 5/5 Wrist Flexion (C7) 5/5 5/5 Grip (C8) 5/5 5/5 Finger Abduction (T1) 5/5 5/5   Sensation: intact to light touch in upper extremities bilaterally   Spurling's:  negative bilaterally Neck ROM: Full active ROM TTP: Cervical paraspinal NTTP: cervical spinous processes,   thoracic paraspinal, trapezius     Electronically signed by:  Odis Mace D.[facility] 9:22 AM 03/24/23

## 2023-03-24 ENCOUNTER — Ambulatory Visit: Payer: BC Managed Care – PPO | Admitting: Sports Medicine

## 2023-03-24 VITALS — BP 118/84 | HR 67 | Ht 62.0 in | Wt 127.0 lb

## 2023-03-24 DIAGNOSIS — R200 Anesthesia of skin: Secondary | ICD-10-CM | POA: Diagnosis not present

## 2023-03-24 DIAGNOSIS — M5010 Cervical disc disorder with radiculopathy, unspecified cervical region: Secondary | ICD-10-CM

## 2023-03-24 DIAGNOSIS — R202 Paresthesia of skin: Secondary | ICD-10-CM | POA: Diagnosis not present

## 2023-03-24 DIAGNOSIS — M542 Cervicalgia: Secondary | ICD-10-CM | POA: Diagnosis not present

## 2023-03-24 NOTE — Patient Instructions (Signed)
 As needed follow up  If symptoms return identically then call and ask for repeat epidural , if symptoms change follow up in clinic

## 2023-04-14 DIAGNOSIS — E109 Type 1 diabetes mellitus without complications: Secondary | ICD-10-CM | POA: Diagnosis not present

## 2023-04-19 DIAGNOSIS — E109 Type 1 diabetes mellitus without complications: Secondary | ICD-10-CM | POA: Diagnosis not present

## 2023-04-19 DIAGNOSIS — E063 Autoimmune thyroiditis: Secondary | ICD-10-CM | POA: Diagnosis not present

## 2023-04-19 DIAGNOSIS — E139 Other specified diabetes mellitus without complications: Secondary | ICD-10-CM | POA: Diagnosis not present

## 2023-05-04 DIAGNOSIS — Z1330 Encounter for screening examination for mental health and behavioral disorders, unspecified: Secondary | ICD-10-CM | POA: Diagnosis not present

## 2023-05-04 DIAGNOSIS — Z01419 Encounter for gynecological examination (general) (routine) without abnormal findings: Secondary | ICD-10-CM | POA: Diagnosis not present

## 2023-05-04 DIAGNOSIS — K6282 Dysplasia of anus: Secondary | ICD-10-CM | POA: Diagnosis not present

## 2023-05-04 DIAGNOSIS — Z1151 Encounter for screening for human papillomavirus (HPV): Secondary | ICD-10-CM | POA: Diagnosis not present

## 2023-05-04 DIAGNOSIS — R92333 Mammographic heterogeneous density, bilateral breasts: Secondary | ICD-10-CM | POA: Diagnosis not present

## 2023-05-19 DIAGNOSIS — M25561 Pain in right knee: Secondary | ICD-10-CM | POA: Diagnosis not present

## 2023-05-19 DIAGNOSIS — M545 Low back pain, unspecified: Secondary | ICD-10-CM | POA: Diagnosis not present

## 2023-05-22 DIAGNOSIS — M25561 Pain in right knee: Secondary | ICD-10-CM | POA: Diagnosis not present

## 2023-05-22 DIAGNOSIS — M545 Low back pain, unspecified: Secondary | ICD-10-CM | POA: Diagnosis not present

## 2023-05-25 DIAGNOSIS — S83231A Complex tear of medial meniscus, current injury, right knee, initial encounter: Secondary | ICD-10-CM | POA: Diagnosis not present

## 2023-05-25 DIAGNOSIS — M25461 Effusion, right knee: Secondary | ICD-10-CM | POA: Diagnosis not present

## 2023-05-25 DIAGNOSIS — M25561 Pain in right knee: Secondary | ICD-10-CM | POA: Diagnosis not present

## 2023-05-25 DIAGNOSIS — M7121 Synovial cyst of popliteal space [Baker], right knee: Secondary | ICD-10-CM | POA: Diagnosis not present

## 2023-05-25 DIAGNOSIS — M2241 Chondromalacia patellae, right knee: Secondary | ICD-10-CM | POA: Diagnosis not present

## 2023-05-26 DIAGNOSIS — M545 Low back pain, unspecified: Secondary | ICD-10-CM | POA: Diagnosis not present

## 2023-05-26 DIAGNOSIS — M25561 Pain in right knee: Secondary | ICD-10-CM | POA: Diagnosis not present

## 2023-06-02 DIAGNOSIS — M545 Low back pain, unspecified: Secondary | ICD-10-CM | POA: Diagnosis not present

## 2023-06-02 DIAGNOSIS — M25561 Pain in right knee: Secondary | ICD-10-CM | POA: Diagnosis not present

## 2023-06-08 DIAGNOSIS — M545 Low back pain, unspecified: Secondary | ICD-10-CM | POA: Diagnosis not present

## 2023-06-08 DIAGNOSIS — M25561 Pain in right knee: Secondary | ICD-10-CM | POA: Diagnosis not present

## 2023-06-20 DIAGNOSIS — M25561 Pain in right knee: Secondary | ICD-10-CM | POA: Diagnosis not present

## 2023-06-20 DIAGNOSIS — M545 Low back pain, unspecified: Secondary | ICD-10-CM | POA: Diagnosis not present

## 2023-06-21 DIAGNOSIS — M25561 Pain in right knee: Secondary | ICD-10-CM | POA: Diagnosis not present

## 2023-06-21 DIAGNOSIS — S83231A Complex tear of medial meniscus, current injury, right knee, initial encounter: Secondary | ICD-10-CM | POA: Diagnosis not present

## 2023-06-21 DIAGNOSIS — S83271A Complex tear of lateral meniscus, current injury, right knee, initial encounter: Secondary | ICD-10-CM | POA: Diagnosis not present

## 2023-06-23 DIAGNOSIS — M25561 Pain in right knee: Secondary | ICD-10-CM | POA: Diagnosis not present

## 2023-06-23 DIAGNOSIS — Z01810 Encounter for preprocedural cardiovascular examination: Secondary | ICD-10-CM | POA: Diagnosis not present

## 2023-06-23 DIAGNOSIS — M545 Low back pain, unspecified: Secondary | ICD-10-CM | POA: Diagnosis not present

## 2023-06-23 DIAGNOSIS — M25569 Pain in unspecified knee: Secondary | ICD-10-CM | POA: Diagnosis not present

## 2023-06-26 DIAGNOSIS — M545 Low back pain, unspecified: Secondary | ICD-10-CM | POA: Diagnosis not present

## 2023-06-26 DIAGNOSIS — M25561 Pain in right knee: Secondary | ICD-10-CM | POA: Diagnosis not present

## 2023-06-28 DIAGNOSIS — E109 Type 1 diabetes mellitus without complications: Secondary | ICD-10-CM | POA: Diagnosis not present

## 2023-06-28 DIAGNOSIS — Z01818 Encounter for other preprocedural examination: Secondary | ICD-10-CM | POA: Diagnosis not present

## 2023-07-12 DIAGNOSIS — E109 Type 1 diabetes mellitus without complications: Secondary | ICD-10-CM | POA: Diagnosis not present

## 2023-07-13 DIAGNOSIS — S83281A Other tear of lateral meniscus, current injury, right knee, initial encounter: Secondary | ICD-10-CM | POA: Diagnosis not present

## 2023-07-13 DIAGNOSIS — S83231A Complex tear of medial meniscus, current injury, right knee, initial encounter: Secondary | ICD-10-CM | POA: Diagnosis not present

## 2023-07-13 DIAGNOSIS — M94261 Chondromalacia, right knee: Secondary | ICD-10-CM | POA: Diagnosis not present

## 2023-07-13 DIAGNOSIS — S83241A Other tear of medial meniscus, current injury, right knee, initial encounter: Secondary | ICD-10-CM | POA: Diagnosis not present

## 2023-07-13 DIAGNOSIS — S83271A Complex tear of lateral meniscus, current injury, right knee, initial encounter: Secondary | ICD-10-CM | POA: Diagnosis not present

## 2023-07-13 NOTE — Therapy (Signed)
 OUTPATIENT PHYSICAL THERAPY LOWER EXTREMITY EVALUATION   Patient Name: Elizabeth [name] MRN: [id number] DOB:06-Aug-1960, 63 y.o., female Today's Date: 07/14/2023  END OF SESSION:  PT End of Session - 07/14/23 1054     Visit Number 1    Date for PT Re-Evaluation 10/06/23    Authorization Type [employer]    PT Start Time 1100    PT Stop Time 1140    PT Time Calculation (min) 40 min             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   Vaginal candidiasis 07/2015   after antibiotics   Past Surgical History:  Procedure Laterality Date   APPENDECTOMY  03/2022   extended appendectomy   AUGMENTATION MAMMAPLASTY Bilateral 2004   COLONOSCOPY  2014   HYSTEROTOMY     Robotic   LAPAROTOMY  1982   LID LESION EXCISION Left 12/22/2017   Procedure: LID LESION EXCISION with reconstruction of lower   left and right eye lids;  Surgeon: Karan Osgood, MD;  Location: [facility] OR;  Service: Ophthalmology;  Laterality: Left;  Frozen sections   MENISCUS REPAIR Left 1997   NASAL SINUS SURGERY  06/2022   TONSILLECTOMY  1966   Patient Active Problem List   Diagnosis Date Noted   Chondromalacia, left knee 12/22/2021   Neuroma of foot 11/02/2021   Scar of back 08/24/2021   Skin lesion 08/10/2021   Degenerative tear of left medial meniscus 01/14/2020   Knee subluxation, left, initial encounter 12/23/2019   Lumbar radiculopathy 07/30/2019   Neck pain 08/15/2018   Nonallopathic lesion of cervical region 08/15/2018    Nonallopathic lesion of thoracic region 08/15/2018   Nonallopathic lesion of rib cage 08/15/2018   Nonallopathic lesion of lumbosacral region 08/15/2018   Nonallopathic lesion of sacral region 08/15/2018   Partial hamstring tear 09/23/2015   Diabetes mellitus (HCC) 09/17/2012    PCP: Selina Dale  REFERRING PROVIDER: Anniece Base  REFERRING DIAG:  [phone] (ICD-10-CM) - S/P right knee arthroscopy  Z98.890 (ICD-10-CM) - S/P arthroscopic partial medial meniscectomy  Z98.890 (ICD-10-CM) - S/P lateral meniscectomy of right knee    THERAPY DIAG:  No diagnosis found.  Rationale for Evaluation and Treatment: Rehabilitation  ONSET DATE: 07/13/23  SUBJECTIVE:   SUBJECTIVE STATEMENT: I have had surgery on my left knee, so it is weak. With the R knee, I was miserable yesterday but doing great today.   PERTINENT HISTORY: s/p R Knee arthroscopy, partial medial and lateral meniscectomies, extensive chondroplasty DOS: 07/13/2023 Freq: 1-2 times per week for 6 weeks Start PT 1-3 days after surgery   PAIN:  Are you having pain? Yes: NPRS scale: 2/10 Pain location: R knee  Pain description: a little sharp medially, overall aching  Aggravating factors: stairs, quick movements  Relieving factors: tramadol, tylenol , ice  PRECAUTIONS: Knee  RED FLAGS: None   WEIGHT BEARING RESTRICTIONS:  WBAT  FALLS:  Has patient fallen in last 6 months? No  LIVING ENVIRONMENT: Lives with: lives with their spouse Lives in: House/apartment Stairs: Yes: Internal: one set of stairs steps; on left going up and External: 3 steps; none Has following equipment at home: Crutches but not using them  OCCUPATION: Nurse  PLOF: Independent  PATIENT GOALS: to be able to kneel and get up off the floor, play with grandkids   NEXT MD VISIT: 07/28/23  OBJECTIVE:  Note: Objective measures were completed at Evaluation unless otherwise noted.  DIAGNOSTIC FINDINGS: N/A for knee  PATIENT SURVEYS:  LEFS  21/80  COGNITION: Overall cognitive status: Within functional limits for tasks assessed     SENSATION: WFL  EDEMA:  Swelling in R knee    POSTURE: No Significant postural limitations  PALPATION: TTP medial knee  LOWER EXTREMITY ROM:  Active ROM Right Eval seated Left eval  Hip flexion    Hip extension    Hip abduction    Hip adduction    Hip internal rotation    Hip external rotation    Knee flexion 97   Knee extension -25   Ankle dorsiflexion    Ankle plantarflexion    Ankle inversion    Ankle eversion     (Blank rows = not tested)  LOWER EXTREMITY MMT:  MMT Right eval Left eval  Hip flexion 3+   Hip extension    Hip abduction    Hip adduction    Hip internal rotation    Hip external rotation    Knee flexion 3+   Knee extension 4-   Ankle dorsiflexion    Ankle plantarflexion    Ankle inversion    Ankle eversion     (Blank rows = not tested)   FUNCTIONAL TESTS:  5 times sit to stand: 14.55s  Timed up and go (TUG): 11.11s  GAIT: Distance walked: in clinic distances Assistive device utilized: None Level of assistance: Modified independence Comments: slightly antalgic, decreased step length and stance time on RLE                                                                                                                                TREATMENT DATE: 07/14/23- EVAL, HEP    PATIENT EDUCATION:  Education details: POC, HEP, icing Person educated: Patient Education method: Explanation Education comprehension: verbalized understanding  HOME EXERCISE PROGRAM: Access Code: URL: [email or web address] Date: 07/14/2023 Prepared by: Donavon Fudge  Exercises - Supine Quad Set  - 1 x daily - 7 x weekly - 2 sets - 10 reps - Supine Heel Slide with Strap  - 1 x daily - 7 x weekly - 2 sets - 10 reps - Active Straight Leg Raise with Quad Set  - 1 x daily - 7 x weekly - 2 sets - 10 reps - Sit to Stand  - 1 x daily - 7 x weekly -  2 sets - 10  reps  ASSESSMENT:  CLINICAL IMPRESSION: Patient is a 63 y.o. female who was seen today for physical therapy evaluation and treatment for s/p right knee arthroscopy, partial medial and lateral menisectomies on 07/13/23. She is ambulating without an assistive device one day post op. She presents with ROM and strength deficits but is doing really well. Was advised to keep icing and to try not to overdo. Due to having a high copay, we will do 2x for first 2 weeks and then dial it down  1x, depending on progress. Pt will benefit from PT to address her impairments to return to PLOF.    OBJECTIVE IMPAIRMENTS: Abnormal gait, decreased mobility, difficulty walking, decreased ROM, decreased strength, and pain.   ACTIVITY LIMITATIONS: squatting, stairs, transfers, and locomotion level  PARTICIPATION LIMITATIONS: driving, shopping, community activity, occupation, and yard work  CLINICAL DECISION MAKING: Stable/uncomplicated  EVALUATION COMPLEXITY: Low  GOALS: Goals reviewed with patient? Yes  SHORT TERM GOALS: Target date: 08/25/23  Patient will be independent with initial HEP. Baseline:  Goal status: INITIAL   2. Patient will be able to ascend/descend stairs with reciprocal step pattern safely to access home and community.  Baseline: pain with stairs Goal status: INITIAL   LONG TERM GOALS: Target date: 10/06/23  Patient will be independent with advanced/ongoing HEP to improve outcomes and carryover.  Baseline:  Goal status: INITIAL  2.  Patient will report at least 75% improvement in R knee pain to improve QOL. Baseline: 2/10 Goal status: INITIAL  3.  Patient will demonstrate improved R knee AROM to >/= 0-120 deg to allow for normal gait and stair mechanics. Baseline: 25-0-97 Goal status: INITIAL  4.  Patient will demonstrate improved functional LE strength as demonstrated by 5/5 in weak muscle groups. Baseline: 3+ hip and knee flexion, 4- ext Goal status: INITIAL  5.   Patient will be able to complete floor transfer and kneel without pain or difficulty to be able to play with her grandchildren  Baseline: unable to do  Goal status: INITIAL  6.  Patient will improve LEFS score to >60/80 to demonstrate minimal functional limitations Baseline: 21/80 Goal status: INITIAL    PLAN:  PT FREQUENCY: 1-2x/week  PT DURATION: 12 weeks  PLANNED INTERVENTIONS: 97110-Therapeutic exercises, 97530- Therapeutic activity, 97112- Neuromuscular re-education, 97535- Self Care, 16109- Manual therapy, [phone]- Gait training, Patient/Family education, Balance training, Stair training, Taping, Dry Needling, Joint mobilization, Cryotherapy, and Moist heat  PLAN FOR NEXT SESSION: R knee strengthening, PROM, functional strengthening   *insurance does not cover vaso*  Protocol from surgeon is placed in the folder  Donavon Fudge, PT 07/14/2023, 11:46 AM

## 2023-07-14 ENCOUNTER — Ambulatory Visit: Attending: Orthopedic Surgery

## 2023-07-14 DIAGNOSIS — M25561 Pain in right knee: Secondary | ICD-10-CM | POA: Insufficient documentation

## 2023-07-14 DIAGNOSIS — Z9889 Other specified postprocedural states: Secondary | ICD-10-CM | POA: Diagnosis not present

## 2023-07-14 DIAGNOSIS — Z87828 Personal history of other (healed) physical injury and trauma: Secondary | ICD-10-CM | POA: Diagnosis not present

## 2023-07-14 DIAGNOSIS — M25661 Stiffness of right knee, not elsewhere classified: Secondary | ICD-10-CM | POA: Diagnosis not present

## 2023-07-14 DIAGNOSIS — M6281 Muscle weakness (generalized): Secondary | ICD-10-CM | POA: Insufficient documentation

## 2023-07-18 ENCOUNTER — Encounter: Payer: Self-pay | Admitting: Physical Therapy

## 2023-07-18 ENCOUNTER — Ambulatory Visit: Admitting: Physical Therapy

## 2023-07-18 DIAGNOSIS — M25661 Stiffness of right knee, not elsewhere classified: Secondary | ICD-10-CM

## 2023-07-18 DIAGNOSIS — M6281 Muscle weakness (generalized): Secondary | ICD-10-CM | POA: Diagnosis not present

## 2023-07-18 DIAGNOSIS — Z87828 Personal history of other (healed) physical injury and trauma: Secondary | ICD-10-CM | POA: Diagnosis not present

## 2023-07-18 DIAGNOSIS — Z9889 Other specified postprocedural states: Secondary | ICD-10-CM

## 2023-07-18 DIAGNOSIS — M25561 Pain in right knee: Secondary | ICD-10-CM

## 2023-07-18 NOTE — Therapy (Signed)
 OUTPATIENT PHYSICAL THERAPY LOWER EXTREMITY EVALUATION   Patient Name: Elizabeth [name] MRN: [id number] DOB:[birth year], 63 y.o., female Today's Date: 07/18/2023  END OF SESSION:  PT End of Session - 07/18/23 0846     Visit Number 2    Date for PT Re-Evaluation 10/06/23    Authorization Type [employer]    PT Start Time 0844    PT Stop Time 0928    PT Time Calculation (min) 44 min    Activity Tolerance Patient tolerated treatment well    Behavior During Therapy [facility] for tasks assessed/performed             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   Vaginal candidiasis 07/2015   after antibiotics   Past Surgical History:  Procedure Laterality Date   APPENDECTOMY  03/2022   extended appendectomy   AUGMENTATION MAMMAPLASTY Bilateral 2004   COLONOSCOPY  2014   HYSTEROTOMY     Robotic   LAPAROTOMY  1982   LID LESION EXCISION Left 12/22/2017   Procedure: LID LESION EXCISION with reconstruction of lower   left and right eye lids;  Surgeon: Karan Osgood, MD;  Location: [facility] OR;  Service: Ophthalmology;  Laterality: Left;  Frozen sections   MENISCUS REPAIR Left 1997   NASAL SINUS SURGERY  06/2022   TONSILLECTOMY  1966   Patient Active Problem List   Diagnosis Date Noted   Chondromalacia, left knee 12/22/2021   Neuroma of foot 11/02/2021   Scar of back 08/24/2021   Skin lesion 08/10/2021   Degenerative tear of left medial meniscus 01/14/2020   Knee subluxation, left, initial encounter 12/23/2019    Lumbar radiculopathy 07/30/2019   Neck pain 08/15/2018   Nonallopathic lesion of cervical region 08/15/2018   Nonallopathic lesion of thoracic region 08/15/2018   Nonallopathic lesion of rib cage 08/15/2018   Nonallopathic lesion of lumbosacral region 08/15/2018   Nonallopathic lesion of sacral region 08/15/2018   Partial hamstring tear 09/23/2015   Diabetes mellitus (HCC) 09/17/2012    PCP: Selina Dale  REFERRING PROVIDER: Anniece Base  REFERRING DIAG:  [phone] (ICD-10-CM) - S/P right knee arthroscopy  Z98.890 (ICD-10-CM) - S/P arthroscopic partial medial meniscectomy  Z98.890 (ICD-10-CM) - S/P lateral meniscectomy of right knee    THERAPY DIAG:  S/P right knee arthroscopy  S/P lateral meniscectomy of right knee  Muscle weakness (generalized)  Acute pain of right knee  Stiffness of right knee, not elsewhere classified  Rationale for Evaluation and Treatment: Rehabilitation  ONSET DATE: 07/13/23  SUBJECTIVE:   SUBJECTIVE STATEMENT: I have some difficulty with the ROM, sore.    PERTINENT HISTORY: s/p R Knee arthroscopy, partial medial and lateral meniscectomies, extensive chondroplasty DOS: 07/13/2023 Freq: 1-2 times per week for 6 weeks Start PT 1-3 days after surgery   PAIN:  Are you having pain? Yes: NPRS scale: 2/10 Pain location: R knee  Pain description: a  little sharp medially, overall aching  Aggravating factors: stairs, quick movements  Relieving factors: tramadol, tylenol , ice  PRECAUTIONS: Knee  RED FLAGS: None   WEIGHT BEARING RESTRICTIONS:  WBAT  FALLS:  Has patient fallen in last 6 months? No  LIVING ENVIRONMENT: Lives with: lives with their spouse Lives in: House/apartment Stairs: Yes: Internal: one set of stairs steps; on left going up and External: 3 steps; none Has following equipment at home: Crutches but not using them  OCCUPATION: Nurse  PLOF: Independent  PATIENT GOALS: to be able to kneel and get up off the floor, play  with grandkids   NEXT MD VISIT: 07/28/23  OBJECTIVE:  Note: Objective measures were completed at Evaluation unless otherwise noted.  DIAGNOSTIC FINDINGS: N/A for knee  PATIENT SURVEYS:  LEFS 21/80  COGNITION: Overall cognitive status: Within functional limits for tasks assessed     SENSATION: WFL  EDEMA:  Swelling in R knee    POSTURE: No Significant postural limitations  PALPATION: TTP medial knee  LOWER EXTREMITY ROM:  Active ROM Right Eval seated Left eval  Hip flexion    Hip extension    Hip abduction    Hip adduction    Hip internal rotation    Hip external rotation    Knee flexion 97   Knee extension -25   Ankle dorsiflexion    Ankle plantarflexion    Ankle inversion    Ankle eversion     (Blank rows = not tested)  LOWER EXTREMITY MMT:  MMT Right eval Left eval  Hip flexion 3+   Hip extension    Hip abduction    Hip adduction    Hip internal rotation    Hip external rotation    Knee flexion 3+   Knee extension 4-   Ankle dorsiflexion    Ankle plantarflexion    Ankle inversion    Ankle eversion     (Blank rows = not tested)   FUNCTIONAL TESTS:  5 times sit to stand: 14.55s  Timed up and go (TUG): 11.11s  GAIT: Distance walked: in clinic distances Assistive device utilized: None Level of assistance: Modified independence Comments: slightly antalgic, decreased step length and stance time on RLE                                                                                                                                TREATMENT DATE:  07/18/23 Bike partial revs x 5 minutes Leg press no wegiht 2 x10, then right only x15 TKE with ball behind knee standing  Resisted gait 30# all directions 4" step ups front and back  Feet on ball K2C, rotation, bridge, isometric abs Passive stretch right knee QS cues for VMO SAQ   07/14/23- EVAL, HEP    PATIENT EDUCATION:  Education details: POC, HEP, icing Person educated:  Patient Education method: Explanation Education comprehension: verbalized understanding  HOME EXERCISE PROGRAM: Access Code: URL: [email or web address] Date: 07/14/2023 Prepared  by: Donavon Fudge  Exercises - Supine Quad Set  - 1 x daily - 7 x weekly - 2 sets - 10 reps - Supine Heel Slide with Strap  - 1 x daily - 7 x weekly - 2 sets - 10 reps - Active Straight Leg Raise with Quad Set  - 1 x daily - 7 x weekly - 2 sets - 10 reps - Sit to Stand  - 1 x daily - 7 x weekly - 2 sets - 10 reps  ASSESSMENT:  CLINICAL IMPRESSION: Patient with some c/o pain at rest, reports very tight superior to the knee, she has some pain medially and some posterior.  Needs verbal and tactile cues to get VMO.  Very itght and pain with flexion, could not do a revolution on the bike, with 4" step ups has significant crepitus so I did try to avoid this some  Patient is a 63 y.o. female who was seen today for physical therapy evaluation and treatment for s/p right knee arthroscopy, partial medial and lateral menisectomies on 07/13/23. She is ambulating without an assistive device one day post op. She presents with ROM and strength deficits but is doing really well. Was advised to keep icing and to try not to overdo. Due to having a high copay, we will do 2x for first 2 weeks and then dial it down  1x, depending on progress. Pt will benefit from PT to address her impairments to return to PLOF.    OBJECTIVE IMPAIRMENTS: Abnormal gait, decreased mobility, difficulty walking, decreased ROM, decreased strength, and pain.   ACTIVITY LIMITATIONS: squatting, stairs, transfers, and locomotion level  PARTICIPATION LIMITATIONS: driving, shopping, community activity, occupation, and yard work  CLINICAL DECISION MAKING: Stable/uncomplicated  EVALUATION COMPLEXITY: Low  GOALS: Goals reviewed with patient? Yes  SHORT TERM GOALS: Target date: 08/25/23  Patient will be independent with initial  HEP. Baseline:  Goal status: progressing 07/18/23   2. Patient will be able to ascend/descend stairs with reciprocal step pattern safely to access home and community.  Baseline: pain with stairs Goal status: INITIAL   LONG TERM GOALS: Target date: 10/06/23  Patient will be independent with advanced/ongoing HEP to improve outcomes and carryover.  Baseline:  Goal status: INITIAL  2.  Patient will report at least 75% improvement in R knee pain to improve QOL. Baseline: 2/10 Goal status: INITIAL  3.  Patient will demonstrate improved R knee AROM to >/= 0-120 deg to allow for normal gait and stair mechanics. Baseline: 25-0-97 Goal status: INITIAL  4.  Patient will demonstrate improved functional LE strength as demonstrated by 5/5 in weak muscle groups. Baseline: 3+ hip and knee flexion, 4- ext Goal status: INITIAL  5.  Patient will be able to complete floor transfer and kneel without pain or difficulty to be able to play with her grandchildren  Baseline: unable to do  Goal status: INITIAL  6.  Patient will improve LEFS score to >60/80 to demonstrate minimal functional limitations Baseline: 21/80 Goal status: INITIAL    PLAN:  PT FREQUENCY: 1-2x/week  PT DURATION: 12 weeks  PLANNED INTERVENTIONS: 97110-Therapeutic exercises, 97530- Therapeutic activity, 97112- Neuromuscular re-education, 97535- Self Care, 96045- Manual therapy, [phone]- Gait training, Patient/Family education, Balance training, Stair training, Taping, Dry Needling, Joint mobilization, Cryotherapy, and Moist heat  PLAN FOR NEXT SESSION: R knee strengthening, PROM, functional strengthening   *insurance does not cover vaso*  Protocol from surgeon is placed in the folder  Chellsie Gomer W, PT 07/18/2023, 8:47 AM

## 2023-07-20 ENCOUNTER — Ambulatory Visit: Attending: Orthopedic Surgery | Admitting: Physical Therapy

## 2023-07-20 ENCOUNTER — Encounter: Payer: Self-pay | Admitting: Physical Therapy

## 2023-07-20 DIAGNOSIS — M25661 Stiffness of right knee, not elsewhere classified: Secondary | ICD-10-CM | POA: Diagnosis not present

## 2023-07-20 DIAGNOSIS — M6281 Muscle weakness (generalized): Secondary | ICD-10-CM | POA: Diagnosis not present

## 2023-07-20 DIAGNOSIS — Z9889 Other specified postprocedural states: Secondary | ICD-10-CM | POA: Insufficient documentation

## 2023-07-20 DIAGNOSIS — M25561 Pain in right knee: Secondary | ICD-10-CM | POA: Insufficient documentation

## 2023-07-20 DIAGNOSIS — Z87828 Personal history of other (healed) physical injury and trauma: Secondary | ICD-10-CM | POA: Insufficient documentation

## 2023-07-20 NOTE — Therapy (Signed)
 OUTPATIENT PHYSICAL THERAPY LOWER EXTREMITY EVALUATION   Patient Name: Elizabeth [name] MRN: [id number] DOB:March 28, 1960, 63 y.o., female Today's Date: 07/20/2023  END OF SESSION:  PT End of Session - 07/20/23 1014     Visit Number 3    Date for PT Re-Evaluation 10/06/23    PT Start Time 1015    PT Stop Time 1100    PT Time Calculation (min) 45 min    Activity Tolerance Patient tolerated treatment well    Behavior During Therapy [facility] for tasks assessed/performed             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   Vaginal candidiasis 07/2015   after antibiotics   Past Surgical History:  Procedure Laterality Date   APPENDECTOMY  03/2022   extended appendectomy   AUGMENTATION MAMMAPLASTY Bilateral 2004   COLONOSCOPY  2014   HYSTEROTOMY     Robotic   LAPAROTOMY  1982   LID LESION EXCISION Left 12/22/2017   Procedure: LID LESION EXCISION with reconstruction of lower   left and right eye lids;  Surgeon: Karan Osgood, MD;  Location: [facility] OR;  Service: Ophthalmology;  Laterality: Left;  Frozen sections   MENISCUS REPAIR Left 1997   NASAL SINUS SURGERY  06/2022   TONSILLECTOMY  1966   Patient Active Problem List   Diagnosis Date Noted   Chondromalacia, left knee 12/22/2021   Neuroma of foot 11/02/2021   Scar of back 08/24/2021   Skin lesion 08/10/2021   Degenerative tear of left medial meniscus 01/14/2020   Knee subluxation, left, initial encounter 12/23/2019   Lumbar radiculopathy 07/30/2019    Neck pain 08/15/2018   Nonallopathic lesion of cervical region 08/15/2018   Nonallopathic lesion of thoracic region 08/15/2018   Nonallopathic lesion of rib cage 08/15/2018   Nonallopathic lesion of lumbosacral region 08/15/2018   Nonallopathic lesion of sacral region 08/15/2018   Partial hamstring tear 09/23/2015   Diabetes mellitus (HCC) 09/17/2012    PCP: Selina Dale  REFERRING PROVIDER: Anniece Base  REFERRING DIAG:  [phone] (ICD-10-CM) - S/P right knee arthroscopy  Z98.890 (ICD-10-CM) - S/P arthroscopic partial medial meniscectomy  Z98.890 (ICD-10-CM) - S/P lateral meniscectomy of right knee    THERAPY DIAG:  S/P right knee arthroscopy  S/P lateral meniscectomy of right knee  Muscle weakness (generalized)  Acute pain of right knee  Stiffness of right knee, not elsewhere classified  Rationale for Evaluation and Treatment: Rehabilitation  ONSET DATE: 07/13/23  SUBJECTIVE:   SUBJECTIVE STATEMENT: Pretty good, feel like a hot poker in between the joint on the medial side. Had to take pain pills last night   PERTINENT HISTORY: s/p R Knee arthroscopy, partial medial and lateral meniscectomies, extensive chondroplasty DOS: 07/13/2023 Freq: 1-2 times per week for 6 weeks Start PT 1-3 days after surgery   PAIN:  Are you having pain? Yes: NPRS scale: 3/10 Pain  location: R knee  Pain description: a little sharp medially, overall aching  Aggravating factors: stairs, quick movements  Relieving factors: tramadol, tylenol , ice  PRECAUTIONS: Knee  RED FLAGS: None   WEIGHT BEARING RESTRICTIONS:  WBAT  FALLS:  Has patient fallen in last 6 months? No  LIVING ENVIRONMENT: Lives with: lives with their spouse Lives in: House/apartment Stairs: Yes: Internal: one set of stairs steps; on left going up and External: 3 steps; none Has following equipment at home: Crutches but not using them  OCCUPATION: Nurse  PLOF: Independent  PATIENT GOALS: to be able to kneel  and get up off the floor, play with grandkids   NEXT MD VISIT: 07/28/23  OBJECTIVE:  Note: Objective measures were completed at Evaluation unless otherwise noted.  DIAGNOSTIC FINDINGS: N/A for knee  PATIENT SURVEYS:  LEFS 21/80  COGNITION: Overall cognitive status: Within functional limits for tasks assessed     SENSATION: WFL  EDEMA:  Swelling in R knee    POSTURE: No Significant postural limitations  PALPATION: TTP medial knee  LOWER EXTREMITY ROM:  Active ROM Right Eval seated Left eval  Hip flexion    Hip extension    Hip abduction    Hip adduction    Hip internal rotation    Hip external rotation    Knee flexion 97   Knee extension -25   Ankle dorsiflexion    Ankle plantarflexion    Ankle inversion    Ankle eversion     (Blank rows = not tested)  LOWER EXTREMITY MMT:  MMT Right eval Left eval  Hip flexion 3+   Hip extension    Hip abduction    Hip adduction    Hip internal rotation    Hip external rotation    Knee flexion 3+   Knee extension 4-   Ankle dorsiflexion    Ankle plantarflexion    Ankle inversion    Ankle eversion     (Blank rows = not tested)   FUNCTIONAL TESTS:  5 times sit to stand: 14.55s  Timed up and go (TUG): 11.11s  GAIT: Distance walked: in clinic distances Assistive device utilized: None Level of assistance: Modified independence Comments: slightly antalgic, decreased step length and stance time on RLE                                                                                                                                TREATMENT DATE:  07/20/23 Nustep L5 Patellar mobs PROM  R knee flex and ext QS x10 R knee Multi angle isometrics Sit to stand with yellow ball 2x10 Blue band HS curl 2x15 4in RLE step up x10 4in RLE step down 2x10 30# resisted lateral steps x3 each way  Backwards x5 Black bar heel raise 2x10 SL stance tapping cones x5  On airex x5  07/18/23 Bike partial revs x 5  minutes Leg press no wegiht 2 x10, then right only x15  TKE with ball behind knee standing  Resisted gait 30# all directions 4" step ups front and back  Feet on ball K2C, rotation, bridge, isometric abs Passive stretch right knee QS cues for VMO SAQ   07/14/23- EVAL, HEP    PATIENT EDUCATION:  Education details: POC, HEP, icing Person educated: Patient Education method: Explanation Education comprehension: verbalized understanding  HOME EXERCISE PROGRAM: Access Code: URL: [email or web address] Date: 07/14/2023 Prepared by: Donavon Fudge  Exercises - Supine Quad Set  - 1 x daily - 7 x weekly - 2 sets - 10 reps - Supine Heel Slide with Strap  - 1 x daily - 7 x weekly - 2 sets - 10 reps - Active Straight Leg Raise with Quad Set  - 1 x daily - 7 x weekly - 2 sets - 10 reps - Sit to Stand  - 1 x daily - 7 x weekly - 2 sets - 10 reps  ASSESSMENT:  CLINICAL IMPRESSION: Patient enters doing well without ace wrap donned. She has some pain medially and some posterior.  Added more functional interventions to today session. R quad did fatigue quick with mult angle isometrics. Pt had did weight distribution with sit to stands. Cues for eccentric control with resisted gait. Some pain with eccentric step downs. Improved ease in mobility after passive stretching.  Patient is a 63 y.o. female who was seen today for physical therapy evaluation and treatment for s/p right knee arthroscopy, partial medial and lateral menisectomies on 07/13/23. She is ambulating without an assistive device one day post op. She presents with ROM and strength deficits but is doing really well. Was advised to keep icing and to try not to overdo. Due to having a high copay, we will do 2x for first 2 weeks and then dial it down  1x, depending on progress. Pt will benefit from PT to address her impairments to return to PLOF.    OBJECTIVE IMPAIRMENTS: Abnormal gait, decreased mobility, difficulty  walking, decreased ROM, decreased strength, and pain.   ACTIVITY LIMITATIONS: squatting, stairs, transfers, and locomotion level  PARTICIPATION LIMITATIONS: driving, shopping, community activity, occupation, and yard work  CLINICAL DECISION MAKING: Stable/uncomplicated  EVALUATION COMPLEXITY: Low  GOALS: Goals reviewed with patient? Yes  SHORT TERM GOALS: Target date: 08/25/23  Patient will be independent with initial HEP. Baseline:  Goal status: progressing 07/18/23   2. Patient will be able to ascend/descend stairs with reciprocal step pattern safely to access home and community.  Baseline: pain with stairs Goal status: INITIAL   LONG TERM GOALS: Target date: 10/06/23  Patient will be independent with advanced/ongoing HEP to improve outcomes and carryover.  Baseline:  Goal status: INITIAL  2.  Patient will report at least 75% improvement in R knee pain to improve QOL. Baseline: 2/10 Goal status: INITIAL  3.  Patient will demonstrate improved R knee AROM to >/= 0-120 deg to allow for normal gait and stair mechanics. Baseline: 25-0-97 Goal status: INITIAL  4.  Patient will demonstrate improved functional LE strength as demonstrated by 5/5 in weak muscle groups. Baseline: 3+ hip and knee flexion, 4- ext Goal status: INITIAL  5.  Patient will be able to complete floor transfer and kneel without pain or difficulty to be able to play with her grandchildren  Baseline: unable to do  Goal status: INITIAL  6.  Patient will improve LEFS score to >60/80 to demonstrate minimal functional limitations Baseline: 21/80 Goal status: INITIAL    PLAN:  PT FREQUENCY: 1-2x/week  PT DURATION: 12 weeks  PLANNED INTERVENTIONS: 97110-Therapeutic exercises, 97530- Therapeutic activity, 97112- Neuromuscular re-education, 97535- Self Care, 10272- Manual therapy, [phone]- Gait training, Patient/Family education, Balance training, Stair training, Taping, Dry Needling, Joint mobilization,  Cryotherapy, and Moist heat  PLAN FOR NEXT SESSION: R knee strengthening, PROM, functional strengthening   *insurance does not cover vaso*  Protocol from surgeon is placed in the folder  Ollen Beverage, PTA 07/20/2023, 10:14 AM

## 2023-07-25 ENCOUNTER — Ambulatory Visit: Admitting: Physical Therapy

## 2023-07-25 ENCOUNTER — Encounter: Payer: Self-pay | Admitting: Physical Therapy

## 2023-07-25 DIAGNOSIS — M25561 Pain in right knee: Secondary | ICD-10-CM

## 2023-07-25 DIAGNOSIS — M25661 Stiffness of right knee, not elsewhere classified: Secondary | ICD-10-CM

## 2023-07-25 DIAGNOSIS — M6281 Muscle weakness (generalized): Secondary | ICD-10-CM

## 2023-07-25 DIAGNOSIS — Z87828 Personal history of other (healed) physical injury and trauma: Secondary | ICD-10-CM | POA: Diagnosis not present

## 2023-07-25 DIAGNOSIS — Z9889 Other specified postprocedural states: Secondary | ICD-10-CM | POA: Diagnosis not present

## 2023-07-25 NOTE — Therapy (Signed)
 OUTPATIENT PHYSICAL THERAPY LOWER EXTREMITY EVALUATION   Patient Name: Elizabeth [name] MRN: [id number] DOB:1961-02-22, 63 y.o., female Today's Date: 07/25/2023  END OF SESSION:  PT End of Session - 07/25/23 1015     Visit Number 4    Date for PT Re-Evaluation 10/06/23    PT Start Time 1015    PT Stop Time 1100    PT Time Calculation (min) 45 min    Activity Tolerance Patient tolerated treatment well    Behavior During Therapy [facility] for tasks assessed/performed             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   Vaginal candidiasis 07/2015   after antibiotics   Past Surgical History:  Procedure Laterality Date   APPENDECTOMY  03/2022   extended appendectomy   AUGMENTATION MAMMAPLASTY Bilateral 2004   COLONOSCOPY  2014   HYSTEROTOMY     Robotic   LAPAROTOMY  1982   LID LESION EXCISION Left 12/22/2017   Procedure: LID LESION EXCISION with reconstruction of lower   left and right eye lids;  Surgeon: Karan Osgood, MD;  Location: [facility] OR;  Service: Ophthalmology;  Laterality: Left;  Frozen sections   MENISCUS REPAIR Left 1997   NASAL SINUS SURGERY  06/2022   TONSILLECTOMY  1966   Patient Active Problem List   Diagnosis Date Noted   Chondromalacia, left knee 12/22/2021   Neuroma of foot 11/02/2021   Scar of back 08/24/2021   Skin lesion 08/10/2021   Degenerative tear of left medial meniscus 01/14/2020   Knee subluxation, left, initial encounter 12/23/2019   Lumbar radiculopathy 07/30/2019    Neck pain 08/15/2018   Nonallopathic lesion of cervical region 08/15/2018   Nonallopathic lesion of thoracic region 08/15/2018   Nonallopathic lesion of rib cage 08/15/2018   Nonallopathic lesion of lumbosacral region 08/15/2018   Nonallopathic lesion of sacral region 08/15/2018   Partial hamstring tear 09/23/2015   Diabetes mellitus (HCC) 09/17/2012    PCP: Selina Dale  REFERRING PROVIDER: Anniece Base  REFERRING DIAG:  [phone] (ICD-10-CM) - S/P right knee arthroscopy  Z98.890 (ICD-10-CM) - S/P arthroscopic partial medial meniscectomy  Z98.890 (ICD-10-CM) - S/P lateral meniscectomy of right knee    THERAPY DIAG:  S/P right knee arthroscopy  S/P lateral meniscectomy of right knee  Muscle weakness (generalized)  Acute pain of right knee  Stiffness of right knee, not elsewhere classified  S/P arthroscopic partial medial meniscectomy of right knee  Rationale for Evaluation and Treatment: Rehabilitation  ONSET DATE: 07/13/23  SUBJECTIVE:   SUBJECTIVE STATEMENT: Pretty good, sleeping better, no progress going down stairs  PERTINENT HISTORY: s/p R Knee arthroscopy, partial medial and lateral meniscectomies, extensive chondroplasty DOS: 07/13/2023 Freq: 1-2 times per week for 6 weeks Start PT 1-3 days after surgery   PAIN:  Are you having pain? Yes: NPRS scale: 2/10 Pain location: R knee  Pain  description: a little sharp medially, overall aching  Aggravating factors: stairs, quick movements  Relieving factors: tramadol, tylenol , ice  PRECAUTIONS: Knee  RED FLAGS: None   WEIGHT BEARING RESTRICTIONS:  WBAT  FALLS:  Has patient fallen in last 6 months? No  LIVING ENVIRONMENT: Lives with: lives with their spouse Lives in: House/apartment Stairs: Yes: Internal: one set of stairs steps; on left going up and External: 3 steps; none Has following equipment at home: Crutches but not using them  OCCUPATION: Nurse  PLOF: Independent  PATIENT GOALS: to be  able to kneel and get up off the floor, play with grandkids   NEXT MD VISIT: 07/28/23  OBJECTIVE:  Note: Objective measures were completed at Evaluation unless otherwise noted.  DIAGNOSTIC FINDINGS: N/A for knee  PATIENT SURVEYS:  LEFS 21/80  COGNITION: Overall cognitive status: Within functional limits for tasks assessed     SENSATION: WFL  EDEMA:  Swelling in R knee    POSTURE: No Significant postural limitations  PALPATION: TTP medial knee  LOWER EXTREMITY ROM:  Active ROM Right Eval seated Left eval  Hip flexion    Hip extension    Hip abduction    Hip adduction    Hip internal rotation    Hip external rotation    Knee flexion 97   Knee extension -25   Ankle dorsiflexion    Ankle plantarflexion    Ankle inversion    Ankle eversion     (Blank rows = not tested)  LOWER EXTREMITY MMT:  MMT Right eval Left eval  Hip flexion 3+   Hip extension    Hip abduction    Hip adduction    Hip internal rotation    Hip external rotation    Knee flexion 3+   Knee extension 4-   Ankle dorsiflexion    Ankle plantarflexion    Ankle inversion    Ankle eversion     (Blank rows = not tested)   FUNCTIONAL TESTS:  5 times sit to stand: 14.55s  Timed up and go (TUG): 11.11s  GAIT: Distance walked: in clinic distances Assistive device utilized: None Level of assistance: Modified independence Comments: slightly antalgic, decreased step length and stance time on RLE                                                                                                                                TREATMENT DATE:  07/25/23 Bike L 1 x6 min SAQ RLE x10, 2lb x10  RLE SLR x10, 1.5lb x 10  W/ ER x10 Heel slides 2x10 2# R hip abd in side lying 2x10   Adduction 2x10 R knee PROM  Patellar mobs STM to popliteal fold   07/20/23 Nustep L5 Patellar mobs PROM  R knee flex and ext QS x10 R knee Multi angle isometrics Sit to stand with yellow ball 2x10 Blue band  HS curl 2x15 4in RLE step up x10 4in RLE step  down 2x10 30# resisted lateral steps x3 each way  Backwards x5 Black bar heel raise 2x10 SL stance tapping cones x5  On airex x5  07/18/23 Bike partial revs x 5 minutes Leg press no wegiht 2 x10, then right only x15 TKE with ball behind knee standing  Resisted gait 30# all directions 4" step ups front and back  Feet on ball K2C, rotation, bridge, isometric abs Passive stretch right knee QS cues for VMO SAQ   07/14/23- EVAL, HEP    PATIENT EDUCATION:  Education details: POC, HEP, icing Person educated: Patient Education method: Explanation Education comprehension: verbalized understanding  HOME EXERCISE PROGRAM: Access Code: URL: [email or web address] Date: 07/14/2023 Prepared by: Donavon Fudge  Exercises - Supine Quad Set  - 1 x daily - 7 x weekly - 2 sets - 10 reps - Supine Heel Slide with Strap  - 1 x daily - 7 x weekly - 2 sets - 10 reps - Active Straight Leg Raise with Quad Set  - 1 x daily - 7 x weekly - 2 sets - 10 reps - Sit to Stand  - 1 x daily - 7 x weekly - 2 sets - 10 reps  ASSESSMENT:  CLINICAL IMPRESSION: Patient enters doing well she will be weeks post op on Thursday. Supine strengthening interventions chosen. Weakness present with SAQ and SLR. R patellar does not fully lock during quad sets. She has some pain medially and some posteriorly with interventions.  R quad did fatigue quick with mult angle isometrics.  Improved ease in mobility after heel slides.  Eval: Patient is a 63 y.o. female who was seen today for physical therapy evaluation and treatment for s/p right knee arthroscopy, partial medial and lateral menisectomies on 07/13/23. She is ambulating without an assistive device one day post op. She presents with ROM and strength deficits but is doing really well. Was advised to keep icing and to try not to overdo. Due to having a high copay, we will do 2x for first 2 weeks and then  dial it down  1x, depending on progress. Pt will benefit from PT to address her impairments to return to PLOF.    OBJECTIVE IMPAIRMENTS: Abnormal gait, decreased mobility, difficulty walking, decreased ROM, decreased strength, and pain.   ACTIVITY LIMITATIONS: squatting, stairs, transfers, and locomotion level  PARTICIPATION LIMITATIONS: driving, shopping, community activity, occupation, and yard work  CLINICAL DECISION MAKING: Stable/uncomplicated  EVALUATION COMPLEXITY: Low  GOALS: Goals reviewed with patient? Yes  SHORT TERM GOALS: Target date: 08/25/23  Patient will be independent with initial HEP. Baseline:  Goal status: progressing 07/18/23   2. Patient will be able to ascend/descend stairs with reciprocal step pattern safely to access home and community.  Baseline: pain with stairs Goal status: INITIAL   LONG TERM GOALS: Target date: 10/06/23  Patient will be independent with advanced/ongoing HEP to improve outcomes and carryover.  Baseline:  Goal status: INITIAL  2.  Patient will report at least 75% improvement in R knee pain to improve QOL. Baseline: 2/10 Goal status: INITIAL  3.  Patient will demonstrate improved R knee AROM to >/= 0-120 deg to allow for normal gait and stair mechanics. Baseline: 25-0-97 Goal status: INITIAL  4.  Patient will demonstrate improved functional LE strength as demonstrated by 5/5 in weak muscle groups. Baseline: 3+ hip and knee flexion, 4- ext Goal status: INITIAL  5.  Patient will be able to complete floor transfer and kneel without pain or difficulty to be able to  play with her grandchildren  Baseline: unable to do  Goal status: INITIAL  6.  Patient will improve LEFS score to >60/80 to demonstrate minimal functional limitations Baseline: 21/80 Goal status: INITIAL    PLAN:  PT FREQUENCY: 1-2x/week  PT DURATION: 12 weeks  PLANNED INTERVENTIONS: 97110-Therapeutic exercises, 97530- Therapeutic activity, 97112-  Neuromuscular re-education, 97535- Self Care, [id number]- Manual therapy, [phone]- Gait training, Patient/Family education, Balance training, Stair training, Taping, Dry Needling, Joint mobilization, Cryotherapy, and Moist heat  PLAN FOR NEXT SESSION: R knee strengthening, PROM, functional strengthening   *insurance does not cover vaso*  Protocol from surgeon is placed in the folder  Ollen Beverage, PTA 07/25/2023, 10:15 AM

## 2023-07-27 ENCOUNTER — Ambulatory Visit: Admitting: Physical Therapy

## 2023-07-27 ENCOUNTER — Encounter: Payer: Self-pay | Admitting: Physical Therapy

## 2023-07-27 DIAGNOSIS — M25561 Pain in right knee: Secondary | ICD-10-CM | POA: Diagnosis not present

## 2023-07-27 DIAGNOSIS — Z9889 Other specified postprocedural states: Secondary | ICD-10-CM

## 2023-07-27 DIAGNOSIS — M6281 Muscle weakness (generalized): Secondary | ICD-10-CM

## 2023-07-27 DIAGNOSIS — M25661 Stiffness of right knee, not elsewhere classified: Secondary | ICD-10-CM

## 2023-07-27 DIAGNOSIS — Z87828 Personal history of other (healed) physical injury and trauma: Secondary | ICD-10-CM | POA: Diagnosis not present

## 2023-07-27 NOTE — Therapy (Signed)
 OUTPATIENT PHYSICAL THERAPY LOWER EXTREMITY EVALUATION   Patient Name: Elizabeth [name] MRN: [id number] DOB:[birth year], 63 y.o., female Today's Date: 07/27/2023  END OF SESSION:  PT End of Session - 07/27/23 0934     Visit Number 5    Date for PT Re-Evaluation 10/06/23    PT Start Time 0930    PT Stop Time 1015    PT Time Calculation (min) 45 min    Activity Tolerance Patient tolerated treatment well    Behavior During Therapy [facility] for tasks assessed/performed             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   Vaginal candidiasis 07/2015   after antibiotics   Past Surgical History:  Procedure Laterality Date   APPENDECTOMY  03/2022   extended appendectomy   AUGMENTATION MAMMAPLASTY Bilateral 2004   COLONOSCOPY  2014   HYSTEROTOMY     Robotic   LAPAROTOMY  1982   LID LESION EXCISION Left 12/22/2017   Procedure: LID LESION EXCISION with reconstruction of lower   left and right eye lids;  Surgeon: Karan Osgood, MD;  Location: [facility] OR;  Service: Ophthalmology;  Laterality: Left;  Frozen sections   MENISCUS REPAIR Left 1997   NASAL SINUS SURGERY  06/2022   TONSILLECTOMY  1966   Patient Active Problem List   Diagnosis Date Noted   Chondromalacia, left knee 12/22/2021   Neuroma of foot 11/02/2021   Scar of back 08/24/2021   Skin lesion 08/10/2021   Degenerative tear of left medial meniscus 01/14/2020   Knee subluxation, left, initial encounter 12/23/2019   Lumbar radiculopathy 07/30/2019    Neck pain 08/15/2018   Nonallopathic lesion of cervical region 08/15/2018   Nonallopathic lesion of thoracic region 08/15/2018   Nonallopathic lesion of rib cage 08/15/2018   Nonallopathic lesion of lumbosacral region 08/15/2018   Nonallopathic lesion of sacral region 08/15/2018   Partial hamstring tear 09/23/2015   Diabetes mellitus (HCC) 09/17/2012    PCP: Selina Dale  REFERRING PROVIDER: Anniece Base  REFERRING DIAG:  [phone] (ICD-10-CM) - S/P right knee arthroscopy  Z98.890 (ICD-10-CM) - S/P arthroscopic partial medial meniscectomy  Z98.890 (ICD-10-CM) - S/P lateral meniscectomy of right knee    THERAPY DIAG:  S/P right knee arthroscopy  S/P lateral meniscectomy of right knee  Muscle weakness (generalized)  Stiffness of right knee, not elsewhere classified  Rationale for Evaluation and Treatment: Rehabilitation  ONSET DATE: 07/13/23  SUBJECTIVE:   SUBJECTIVE STATEMENT: "Not as good as I did on tuesday"   PERTINENT HISTORY: s/p R Knee arthroscopy, partial medial and lateral meniscectomies, extensive chondroplasty DOS: 07/13/2023 Freq: 1-2 times per week for 6 weeks Start PT 1-3 days after surgery   PAIN:  Are you having pain? Yes: NPRS scale: 2/10 Pain location: R knee  Pain description: a little sharp medially, overall aching  Aggravating factors: stairs, quick movements  Relieving  factors: tramadol, tylenol , ice  PRECAUTIONS: Knee  RED FLAGS: None   WEIGHT BEARING RESTRICTIONS: WBAT  FALLS:  Has patient fallen in last 6 months? No  LIVING ENVIRONMENT: Lives with: lives with their spouse Lives in: House/apartment Stairs: Yes: Internal: one set of stairs steps; on left going up and External: 3 steps; none Has following equipment at home: Crutches but not using them  OCCUPATION: Nurse  PLOF: Independent  PATIENT GOALS: to be able to kneel and get up off the floor, play with grandkids   NEXT MD VISIT: 07/28/23  OBJECTIVE:  Note: Objective  measures were completed at Evaluation unless otherwise noted.  DIAGNOSTIC FINDINGS: N/A for knee  PATIENT SURVEYS:  LEFS 21/80  COGNITION: Overall cognitive status: Within functional limits for tasks assessed     SENSATION: WFL  EDEMA:  Swelling in R knee    POSTURE: No Significant postural limitations  PALPATION: TTP medial knee  LOWER EXTREMITY ROM:  Active ROM Right Eval seated Right 07/27/23  Hip flexion    Hip extension    Hip abduction    Hip adduction    Hip internal rotation    Hip external rotation    Knee flexion 97 120  Knee extension -25 5  Ankle dorsiflexion    Ankle plantarflexion    Ankle inversion    Ankle eversion     (Blank rows = not tested)  LOWER EXTREMITY MMT:  MMT Right eval Left eval  Hip flexion 3+   Hip extension    Hip abduction    Hip adduction    Hip internal rotation    Hip external rotation    Knee flexion 3+   Knee extension 4-   Ankle dorsiflexion    Ankle plantarflexion    Ankle inversion    Ankle eversion     (Blank rows = not tested)   FUNCTIONAL TESTS:  5 times sit to stand: 14.55s  Timed up and go (TUG): 11.11s  GAIT: Distance walked: in clinic distances Assistive device utilized: None Level of assistance: Modified independence Comments: slightly antalgic, decreased step length and stance time on RLE                                                                                                                                TREATMENT DATE:  07/27/23 Bike L 2 x 6 min Feels tight and full n the medial side and the back Multi angle Isometrics RLE LAQ HS curls green tband  Wall site 5x15'' Sit to stands 2x15 SLS ball toss then with airex 4in forward & lateral step ups RLE x 10  Heel raises 2x10  R knee PROM  Patellar mobs RLE SLR 2x10   07/25/23 Bike L 1 x6 min SAQ RLE x10, 2lb x10  RLE SLR x10, 1.5lb x 10  W/ ER x10 Heel slides 2x10 2# R hip abd in side lying 2x10   Adduction 2x10 R knee  PROM  Patellar mobs STM to popliteal fold   07/20/23 Nustep L5 Patellar mobs PROM  R knee flex and ext QS x10 R knee Multi angle isometrics Sit to stand with yellow ball 2x10 Blue band HS curl 2x15 4in RLE step up x10 4in RLE step down 2x10 30# resisted lateral steps x3 each way  Backwards x5 Black bar heel raise 2x10 SL stance tapping cones x5  On airex x5  07/18/23 Bike partial revs x 5 minutes Leg press no wegiht 2 x10, then right only x15 TKE with ball behind knee standing  Resisted gait 30# all directions 4" step ups front and back  Feet on ball K2C, rotation, bridge, isometric abs Passive stretch right knee QS cues for VMO SAQ   07/14/23- EVAL, HEP    PATIENT EDUCATION:  Education details: POC, HEP, icing Person educated: Patient Education method: Explanation Education comprehension: verbalized understanding  HOME EXERCISE PROGRAM: Access Code: URL: [email or web address] Date: 07/14/2023 Prepared by: Donavon Fudge  Exercises - Supine Quad Set  - 1 x daily - 7 x weekly - 2 sets - 10 reps - Supine Heel Slide with Strap  - 1 x daily - 7 x weekly - 2 sets - 10 reps - Active Straight Leg Raise with Quad Set  - 1 x daily - 7 x weekly - 2 sets - 10 reps - Sit to Stand  - 1 x daily - 7 x weekly - 2 sets - 10 reps  ASSESSMENT:  CLINICAL IMPRESSION: Patient enters doing well she will and is currently 2 weeks post off. Added more functional closed chain interventions. Weakness and pain present during the eccentric phase od step ups.  R quad continues to fatigue quick with mult angle isometrics and SLS ball toss. Pt has progressed increasing her R knee AOM.  Eval: Patient is a 63 y.o. female who was seen today for physical therapy evaluation and treatment for s/p right knee arthroscopy, partial medial and lateral menisectomies on 07/13/23. She is ambulating without an assistive device one day post op. She presents with ROM and strength  deficits but is doing really well. Was advised to keep icing and to try not to overdo. Due to having a high copay, we will do 2x for first 2 weeks and then dial it down  1x, depending on progress. Pt will benefit from PT to address her impairments to return to PLOF.    OBJECTIVE IMPAIRMENTS: Abnormal gait, decreased mobility, difficulty walking, decreased ROM, decreased strength, and pain.   ACTIVITY LIMITATIONS: squatting, stairs, transfers, and locomotion level  PARTICIPATION LIMITATIONS: driving, shopping, community activity, occupation, and yard work  CLINICAL DECISION MAKING: Stable/uncomplicated  EVALUATION COMPLEXITY: Low  GOALS: Goals reviewed with patient? Yes  SHORT TERM GOALS: Target date: 08/25/23  Patient will be independent with initial HEP. Baseline:  Goal status: progressing 07/18/23, Met 07/27/23   2. Patient will be able to ascend/descend stairs with reciprocal step pattern safely to access home and community.  Baseline: pain with stairs Goal status: Ongoing 07/27/23   LONG TERM GOALS: Target date: 10/06/23  Patient will be independent with advanced/ongoing HEP to improve outcomes and carryover.  Baseline:  Goal status: INITIAL  2.  Patient will report at least 75% improvement in R knee pain to improve QOL. Baseline: 2/10 Goal status: INITIAL  3.  Patient will demonstrate improved R knee AROM to >/= 0-120 deg to allow for normal gait and stair mechanics. Baseline: 25-0-97 Goal status: INITIAL  4.  Patient will demonstrate improved functional LE strength as demonstrated by 5/5 in weak muscle groups. Baseline: 3+ hip and knee flexion, 4- ext Goal status: INITIAL  5.  Patient will be able to complete floor transfer and kneel without pain or difficulty to be able to play with her grandchildren  Baseline: unable to do  Goal status: INITIAL  6.  Patient will improve LEFS score to >60/80 to demonstrate minimal functional limitations Baseline: 21/80 Goal status:  INITIAL    PLAN:  PT FREQUENCY: 1-2x/week  PT DURATION: 12 weeks  PLANNED INTERVENTIONS: 97110-Therapeutic exercises, 97530- Therapeutic activity, 97112- Neuromuscular re-education, 97535- Self Care, [id number]- Manual therapy, [phone]- Gait training, Patient/Family education, Balance training, Stair training, Taping, Dry Needling, Joint mobilization, Cryotherapy, and Moist heat  PLAN FOR NEXT SESSION: R knee strengthening, PROM, functional strengthening   *insurance does not cover vaso*  Protocol from surgeon is placed in the folder  Ollen Beverage, PTA 07/27/2023, 9:34 AM

## 2023-08-01 ENCOUNTER — Ambulatory Visit: Admitting: Physical Therapy

## 2023-08-01 ENCOUNTER — Encounter: Payer: Self-pay | Admitting: Physical Therapy

## 2023-08-01 DIAGNOSIS — M25661 Stiffness of right knee, not elsewhere classified: Secondary | ICD-10-CM | POA: Diagnosis not present

## 2023-08-01 DIAGNOSIS — Z9889 Other specified postprocedural states: Secondary | ICD-10-CM | POA: Diagnosis not present

## 2023-08-01 DIAGNOSIS — Z87828 Personal history of other (healed) physical injury and trauma: Secondary | ICD-10-CM | POA: Diagnosis not present

## 2023-08-01 DIAGNOSIS — M6281 Muscle weakness (generalized): Secondary | ICD-10-CM

## 2023-08-01 DIAGNOSIS — M25561 Pain in right knee: Secondary | ICD-10-CM

## 2023-08-01 NOTE — Therapy (Signed)
 OUTPATIENT PHYSICAL THERAPY LOWER EXTREMITY TREATMENT   Patient Name: Elizabeth [name] MRN: [id number] DOB:Jun 01, 1960, 63 y.o., female Today's Date: 08/01/2023  END OF SESSION:  PT End of Session - 08/01/23 1058     Visit Number 6    Date for PT Re-Evaluation 10/06/23    Authorization Type [employer]    PT Start Time 1058    PT Stop Time 1145    PT Time Calculation (min) 47 min    Activity Tolerance Patient tolerated treatment well    Behavior During Therapy WFL for tasks assessed/performed             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   Vaginal candidiasis 07/2015   after antibiotics   Past Surgical History:  Procedure Laterality Date   APPENDECTOMY  03/2022   extended appendectomy   AUGMENTATION MAMMAPLASTY Bilateral 2004   COLONOSCOPY  2014   HYSTEROTOMY     Robotic   LAPAROTOMY  1982   LID LESION EXCISION Left 12/22/2017   Procedure: LID LESION EXCISION with reconstruction of lower   left and right eye lids;  Surgeon: Karan Osgood, MD;  Location: [facility] OR;  Service: Ophthalmology;  Laterality: Left;  Frozen sections   MENISCUS REPAIR Left 1997   NASAL SINUS SURGERY  06/2022   TONSILLECTOMY  1966   Patient Active Problem List   Diagnosis Date Noted   Chondromalacia, left knee 12/22/2021   Neuroma of foot 11/02/2021   Scar of back 08/24/2021   Skin lesion 08/10/2021   Degenerative tear of left medial meniscus 01/14/2020   Knee subluxation, left, initial encounter 12/23/2019    Lumbar radiculopathy 07/30/2019   Neck pain 08/15/2018   Nonallopathic lesion of cervical region 08/15/2018   Nonallopathic lesion of thoracic region 08/15/2018   Nonallopathic lesion of rib cage 08/15/2018   Nonallopathic lesion of lumbosacral region 08/15/2018   Nonallopathic lesion of sacral region 08/15/2018   Partial hamstring tear 09/23/2015   Diabetes mellitus (HCC) 09/17/2012    PCP: Selina Dale  REFERRING PROVIDER: Anniece Base  REFERRING DIAG:  [phone] (ICD-10-CM) - S/P right knee arthroscopy  Z98.890 (ICD-10-CM) - S/P arthroscopic partial medial meniscectomy  Z98.890 (ICD-10-CM) - S/P lateral meniscectomy of right knee    THERAPY DIAG:  S/P right knee arthroscopy  S/P lateral meniscectomy of right knee  Muscle weakness (generalized)  Stiffness of right knee, not elsewhere classified  Acute pain of right knee  Rationale for Evaluation and Treatment: Rehabilitation  ONSET DATE: 07/13/23  SUBJECTIVE:   SUBJECTIVE STATEMENT: Reports saw the PA on Friday, felt like I was doing okay surprised I was having difficulty with stairs, reports feels tight, reports pain with kicking covers off at night up to a 5/10  PERTINENT HISTORY: s/p R Knee arthroscopy, partial medial and lateral meniscectomies, extensive chondroplasty DOS: 07/13/2023 Freq: 1-2 times per week for 6 weeks Start PT  1-3 days after surgery   PAIN:  Are you having pain? Yes: NPRS scale: 2/10 Pain location: R knee  Pain description: a little sharp medially, overall aching  Aggravating factors: stairs, quick movements  Relieving factors: tramadol, tylenol , ice  PRECAUTIONS: Knee  RED FLAGS: None   WEIGHT BEARING RESTRICTIONS: WBAT  FALLS:  Has patient fallen in last 6 months? No  LIVING ENVIRONMENT: Lives with: lives with their spouse Lives in: House/apartment Stairs: Yes: Internal: one set of stairs steps; on left going up and External: 3 steps; none Has following equipment at home:  Crutches but not using them  OCCUPATION: Nurse  PLOF: Independent  PATIENT GOALS: to be able to kneel and get up off the floor, play with grandkids   NEXT MD VISIT: 07/28/23  OBJECTIVE:  Note: Objective measures were completed at Evaluation unless otherwise noted.  DIAGNOSTIC FINDINGS: N/A for knee  PATIENT SURVEYS:  LEFS 21/80  COGNITION: Overall cognitive status: Within functional limits for tasks assessed     SENSATION: WFL  EDEMA:  Swelling in R knee    POSTURE: No Significant postural limitations  PALPATION: TTP medial knee  LOWER EXTREMITY ROM:  Active ROM Right Eval seated Right 07/27/23 Right  AROM 08/01/23  Hip flexion     Hip extension     Hip abduction     Hip adduction     Hip internal rotation     Hip external rotation     Knee flexion 97 120 122  Knee extension -25 5 10   Ankle dorsiflexion     Ankle plantarflexion     Ankle inversion     Ankle eversion      (Blank rows = not tested)  LOWER EXTREMITY MMT:  MMT Right eval Left eval  Hip flexion 3+   Hip extension    Hip abduction    Hip adduction    Hip internal rotation    Hip external rotation    Knee flexion 3+   Knee extension 4-   Ankle dorsiflexion    Ankle plantarflexion    Ankle inversion    Ankle eversion     (Blank rows = not tested)   FUNCTIONAL TESTS:  5 times sit to stand: 14.55s  Timed up and go (TUG): 11.11s  GAIT: Distance walked: in clinic distances Assistive device utilized: None Level of assistance: Modified independence Comments: slightly antalgic, decreased step length and stance time on RLE                                                                                                                                TREATMENT DATE:  08/01/23 Stairs step over step up and down two flights Bike level 4 x 6 minutes Passive stretch right knee flexion and extension Sit to stand from lower surfaces Calf stretches Leg press 20# both legs x 10, then right  only no weight  Feet on ball K2C, bridges, isometric abs SAQ  with verbal and tactile cues for VMO STM to the HS and ITB with Tgun, with hands around the patella and distal quads  07/27/23 Bike L 2 x 6 min Feels tight and full n the medial side and the back Multi angle Isometrics RLE LAQ HS curls green tband  Wall site 5x15'' Sit to stands 2x15 SLS ball toss then with airex 4in forward & lateral step ups RLE x 10  Heel raises 2x10  R knee PROM  Patellar mobs RLE SLR 2x10   07/25/23 Bike L 1 x6 min SAQ RLE x10, 2lb x10  RLE SLR x10, 1.5lb x 10  W/ ER x10 Heel slides 2x10 2# R hip abd in side lying 2x10   Adduction 2x10 R knee PROM  Patellar mobs STM to popliteal fold   07/20/23 Nustep L5 Patellar mobs PROM  R knee flex and ext QS x10 R knee Multi angle isometrics Sit to stand with yellow ball 2x10 Blue band HS curl 2x15 4in RLE step up x10 4in RLE step down 2x10 30# resisted lateral steps x3 each way  Backwards x5 Black bar heel raise 2x10 SL stance tapping cones x5  On airex x5  07/18/23 Bike partial revs x 5 minutes Leg press no wegiht 2 x10, then right only x15 TKE with ball behind knee standing  Resisted gait 30# all directions 4" step ups front and back  Feet on ball K2C, rotation, bridge, isometric abs Passive stretch right knee QS cues for VMO SAQ   07/14/23- EVAL, HEP    PATIENT EDUCATION:  Education details: POC, HEP, icing Person educated: Patient Education method: Explanation Education comprehension: verbalized understanding  HOME EXERCISE PROGRAM: Access Code: URL: [email or web address] Date: 07/14/2023 Prepared by: Donavon Fudge  Exercises - Supine Quad Set  - 1 x daily - 7 x weekly - 2 sets - 10 reps - Supine Heel Slide with Strap  - 1 x daily - 7 x weekly - 2 sets - 10 reps - Active Straight Leg Raise with Quad Set  - 1 x daily - 7 x weekly - 2 sets - 10 reps - Sit to Stand  - 1 x daily - 7 x weekly - 2 sets  - 10 reps  ASSESSMENT:  CLINICAL IMPRESSION: Patient reports frustration with some pain/tightness in the proximal patellar area with stairs and with sitting on toilet.  She made great increase in AROM, still lacking TKE, has some popping laterally with flexion to and from extension, I added STM to this area today.  Weakness and pain present during the eccentric phase od step ups.  R quad continues to fatigue quick with mult angle isometrics and SLS ball toss. Pt has progressed increasing her R knee AOM.  Eval: Patient is a 63 y.o. female who was seen today for physical therapy evaluation and treatment for s/p right knee arthroscopy, partial medial and lateral menisectomies on 07/13/23. She is ambulating without an assistive device one day post op. She presents with ROM and strength deficits but is doing really well. Was advised to keep icing and to try not to overdo. Due to having a high copay, we will do 2x for first 2 weeks and then dial it down  1x, depending on progress. Pt will benefit from PT to address her impairments to return to PLOF.    OBJECTIVE IMPAIRMENTS: Abnormal gait, decreased mobility, difficulty walking, decreased ROM, decreased strength, and pain.   ACTIVITY LIMITATIONS: squatting, stairs, transfers, and locomotion level  PARTICIPATION  LIMITATIONS: driving, shopping, community activity, occupation, and yard work  CLINICAL DECISION MAKING: Stable/uncomplicated  EVALUATION COMPLEXITY: Low  GOALS: Goals reviewed with patient? Yes  SHORT TERM GOALS: Target date: 08/25/23  Patient will be independent with initial HEP. Baseline:  Goal status: progressing 07/18/23, Met 07/27/23   2. Patient will be able to ascend/descend stairs with reciprocal step pattern safely to access home and community.  Baseline: pain with stairs Goal status: met 08/01/23   LONG TERM GOALS: Target date: 10/06/23  Patient will be independent with advanced/ongoing HEP to improve outcomes and carryover.   Baseline:  Goal status: INITIAL  2.  Patient will report at least 75% improvement in R knee pain to improve QOL. Baseline: 2/10 Goal status: INITIAL  3.  Patient will demonstrate improved R knee AROM to >/= 0-120 deg to allow for normal gait and stair mechanics. Baseline: 25-0-97 Goal status: INITIAL  4.  Patient will demonstrate improved functional LE strength as demonstrated by 5/5 in weak muscle groups. Baseline: 3+ hip and knee flexion, 4- ext Goal status: INITIAL  5.  Patient will be able to complete floor transfer and kneel without pain or difficulty to be able to play with her grandchildren  Baseline: unable to do  Goal status: INITIAL  6.  Patient will improve LEFS score to >60/80 to demonstrate minimal functional limitations Baseline: 21/80 Goal status: INITIAL    PLAN:  PT FREQUENCY: 1-2x/week  PT DURATION: 12 weeks  PLANNED INTERVENTIONS: 97110-Therapeutic exercises, 97530- Therapeutic activity, 97112- Neuromuscular re-education, 97535- Self Care, [id number]- Manual therapy, [phone]- Gait training, Patient/Family education, Balance training, Stair training, Taping, Dry Needling, Joint mobilization, Cryotherapy, and Moist heat  PLAN FOR NEXT SESSION: see how today went, TKE  *insurance does not cover vaso*  Protocol from surgeon is placed in the folder  Janett Kamath W, PT 08/01/2023, 10:58 AM

## 2023-08-03 ENCOUNTER — Ambulatory Visit: Admitting: Physical Therapy

## 2023-08-08 ENCOUNTER — Ambulatory Visit: Admitting: Physical Therapy

## 2023-08-10 ENCOUNTER — Ambulatory Visit

## 2023-08-12 ENCOUNTER — Telehealth: Payer: Self-pay | Admitting: Family Medicine

## 2023-08-12 DIAGNOSIS — S30861A Insect bite (nonvenomous) of abdominal wall, initial encounter: Secondary | ICD-10-CM

## 2023-08-12 DIAGNOSIS — W57XXXA Bitten or stung by nonvenomous insect and other nonvenomous arthropods, initial encounter: Secondary | ICD-10-CM

## 2023-08-12 MED ORDER — DOXYCYCLINE HYCLATE 100 MG PO TABS: 100.0000 mg | ORAL_TABLET | Freq: Two times a day (BID) | ORAL | 0 refills | Status: AC

## 2023-08-12 NOTE — Progress Notes (Signed)
 E-Visit for Tick Bite  Thank you for describing your tick bite, Here is how we plan to help! Based on the information that you shared with me it looks like you have A tick that bite that we will treat with a short course of doxycycline.  In most cases a tick bite is painless and does not itch.  Most tick bites in which the tick is quickly removed do not require prescriptions. Ticks can transmit several diseases if they are infected and remain attacked to your skin. Therefore the length that the tick was attached and any symptoms you have experienced after the bite are import to accurately develop your custom treatment plan. In most cases a single dose of doxycycline may prevent the development of a more serious condition.  Based on your information I have Provided a home care guide for tick bites and  instructions on when to call for help. and I have sent a single dose of doxycycline to the pharmacy you selected. Please make sure that you selected a pharmacy that is open now.tick bite  Which ticks  are associated with illness?  The Wood Tick (dog tick) is the size of a watermelon seed and can sometimes transmit [facility] spotted fever and [place]  tick fever.   The Deer Tick (black-legged tick) is between the size of a poppy seed (pin head) and an apple seed, and can sometimes transmit Lyme disease.  A brown to black tick with a white splotch on its back is likely a female Amblyomma americanum ([place] tick). This tick has been associated with Southern Tick Associated illness ( STARI)  Lyme disease has become the most common tick-borne illness in the [place] . The risk of Lyme disease following a recognized deer tick bite is estimated to be 1%.  The majority of cases of Lyme disease start with a bull's eye rash at the site of the tick bite. The rash can occur days to weeks (typically 7-10 days) after a tick bite. Treatment with antibiotics is indicated if this rash appears. Flu-like  symptoms may accompany the rash, including: fever, chills, headaches, muscle aches, and fatigue. Removing ticks promptly may prevent tick borne disease.  What can be used to prevent Tick Bites?  Insect repellant with at leas 20% DEET. Wearing long pants with sock and shoes. Avoiding tall grass and heavily wooded areas. Checking your skin after being outdoors. Shower with a washcloth after outdoor exposures.  HOME CARE ADVICE FOR TICK BITE  Wood Tick Removal:  Use a pair of tweezers and grasp the wood tick close to the skin (on its head). Pull the wood tick straight upward without twisting or crushing it. Maintain a steady pressure until it releases its grip.   If tweezers aren't available, use fingers, a loop of thread around the jaws, or a needle between the jaws for traction.  Note: covering the tick with petroleum jelly, nail polish or rubbing alcohol doesn't work. Neither does touching the tick with a hot or cold object. Tiny Deer Tick Removal:   Needs to be scraped off with a knife blade or credit card edge. Place tick in a sealed container (e.g. glass jar, zip lock plastic bag), in case your doctor wants to see it. Tick's Head Removal:  If the wood tick's head breaks off in the skin, it must be removed. Clean the skin. Then use a sterile needle to uncover the head and lift it out or scrape it off.  If a very small  piece of the head remains, the skin will eventually slough it off. Antibiotic Ointment:  Wash the wound and your hands with soap and water after removal to prevent catching any tick disease.  Apply an over the counter antibiotic ointment (e.g. bacitracin) to the bite once. Expected Course: Tick bites normally don't itch or hurt. That's why they often go unnoticed. Call Your Doctor If:  You can't remove the tick or the tick's head Fever, a severe head ache, or rash occur in the next 2 weeks Bite begins to look infected Lyme's disease is common in your area You have not  had a tetanus in the last 10 years Your current symptoms become worse    MAKE SURE YOU  Understand these instructions. Will watch your condition. Will get help right away if you are not doing well or get worse.    Thank you for choosing an e-visit.  Your e-visit answers were reviewed by a board certified advanced clinical practitioner to complete your personal care plan. Depending upon the condition, your plan could have included both over the counter or prescription medications.  Please review your pharmacy choice. Make sure the pharmacy is open so you can pick up prescription now. If there is a problem, you may contact your provider through [employer] and have the prescription routed to another pharmacy.  Your safety is important to us . If you have drug allergies check your prescription carefully.   For the next 24 hours you can use MyChart to ask questions about today's visit, request a non-urgent call back, or ask for a work or school excuse.   have provided 5 minutes of non face to face time during this encounter for chart review and documentation.   You will get an email in the next two days asking about your experience. I hope that your e-visit has been valuable and will speed your recovery.

## 2023-08-21 ENCOUNTER — Ambulatory Visit: Attending: Orthopedic Surgery | Admitting: Physical Therapy

## 2023-08-21 ENCOUNTER — Encounter: Payer: Self-pay | Admitting: Physical Therapy

## 2023-08-21 DIAGNOSIS — M25561 Pain in right knee: Secondary | ICD-10-CM | POA: Insufficient documentation

## 2023-08-21 DIAGNOSIS — M25661 Stiffness of right knee, not elsewhere classified: Secondary | ICD-10-CM | POA: Diagnosis not present

## 2023-08-21 DIAGNOSIS — Z9889 Other specified postprocedural states: Secondary | ICD-10-CM | POA: Insufficient documentation

## 2023-08-21 DIAGNOSIS — Z87828 Personal history of other (healed) physical injury and trauma: Secondary | ICD-10-CM | POA: Insufficient documentation

## 2023-08-21 DIAGNOSIS — M6281 Muscle weakness (generalized): Secondary | ICD-10-CM | POA: Insufficient documentation

## 2023-08-21 NOTE — Therapy (Signed)
 OUTPATIENT PHYSICAL THERAPY LOWER EXTREMITY TREATMENT   Patient Name: Elizabeth [name] MRN: [id number] DOB:April 01, 1960, 63 y.o., female Today's Date: 08/21/2023  END OF SESSION:  PT End of Session - 08/21/23 0800     Visit Number 7    Date for PT Re-Evaluation 10/06/23    PT Start Time 0800    PT Stop Time 0845    PT Time Calculation (min) 45 min    Activity Tolerance Patient tolerated treatment well    Behavior During Therapy [facility] for tasks assessed/performed             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   Vaginal candidiasis 07/2015   after antibiotics   Past Surgical History:  Procedure Laterality Date   APPENDECTOMY  03/2022   extended appendectomy   AUGMENTATION MAMMAPLASTY Bilateral 2004   COLONOSCOPY  2014   HYSTEROTOMY     Robotic   LAPAROTOMY  1982   LID LESION EXCISION Left 12/22/2017   Procedure: LID LESION EXCISION with reconstruction of lower   left and right eye lids;  Surgeon: Karan Osgood, MD;  Location: [facility] OR;  Service: Ophthalmology;  Laterality: Left;  Frozen sections   MENISCUS REPAIR Left 1997   NASAL SINUS SURGERY  06/2022   TONSILLECTOMY  1966   Patient Active Problem List   Diagnosis Date Noted   Chondromalacia, left knee 12/22/2021   Neuroma of foot 11/02/2021   Scar of back 08/24/2021   Skin lesion 08/10/2021   Degenerative tear of left medial meniscus 01/14/2020   Knee subluxation, left, initial encounter 12/23/2019   Lumbar radiculopathy 07/30/2019    Neck pain 08/15/2018   Nonallopathic lesion of cervical region 08/15/2018   Nonallopathic lesion of thoracic region 08/15/2018   Nonallopathic lesion of rib cage 08/15/2018   Nonallopathic lesion of lumbosacral region 08/15/2018   Nonallopathic lesion of sacral region 08/15/2018   Partial hamstring tear 09/23/2015   Diabetes mellitus (HCC) 09/17/2012    PCP: Selina Dale  REFERRING PROVIDER: Anniece Base  REFERRING DIAG:  [phone] (ICD-10-CM) - S/P right knee arthroscopy  Z98.890 (ICD-10-CM) - S/P arthroscopic partial medial meniscectomy  Z98.890 (ICD-10-CM) - S/P lateral meniscectomy of right knee    THERAPY DIAG:  S/P right knee arthroscopy  S/P lateral meniscectomy of right knee  Muscle weakness (generalized)  Stiffness of right knee, not elsewhere classified  Acute pain of right knee  Rationale for Evaluation and Treatment: Rehabilitation  ONSET DATE: 07/13/23  SUBJECTIVE:   SUBJECTIVE STATEMENT: Pretty good, still can't kneel don and get back up, tight horizontal band from scar to scar  PERTINENT HISTORY: s/p R Knee arthroscopy, partial medial and lateral meniscectomies, extensive chondroplasty DOS: 07/13/2023 Freq: 1-2 times per week for 6 weeks Start PT 1-3 days after surgery   PAIN:  Are you having pain? Yes: NPRS scale: 0/10 Pain location: R knee  Pain description:  a little sharp medially, overall aching  Aggravating factors: stairs, quick movements  Relieving factors: tramadol, tylenol , ice  PRECAUTIONS: Knee  RED FLAGS: None   WEIGHT BEARING RESTRICTIONS: WBAT  FALLS:  Has patient fallen in last 6 months? No  LIVING ENVIRONMENT: Lives with: lives with their spouse Lives in: House/apartment Stairs: Yes: Internal: one set of stairs steps; on left going up and External: 3 steps; none Has following equipment at home: Crutches but not using them  OCCUPATION: Nurse  PLOF: Independent  PATIENT GOALS: to be able to kneel and get up off the  floor, play with grandkids   NEXT MD VISIT: 07/28/23  OBJECTIVE:  Note: Objective measures were completed at Evaluation unless otherwise noted.  DIAGNOSTIC FINDINGS: N/A for knee  PATIENT SURVEYS:  LEFS 21/80  COGNITION: Overall cognitive status: Within functional limits for tasks assessed     SENSATION: WFL  EDEMA:  Swelling in R knee    POSTURE: No Significant postural limitations  PALPATION: TTP medial knee  LOWER EXTREMITY ROM:  Active ROM Right Eval seated Right 07/27/23 Right  AROM 08/01/23 Right AROM 08/21/23  Hip flexion      Hip extension      Hip abduction      Hip adduction      Hip internal rotation      Hip external rotation      Knee flexion 97 120 122 125  Knee extension -25 5 10 2   Ankle dorsiflexion      Ankle plantarflexion      Ankle inversion      Ankle eversion       (Blank rows = not tested)  LOWER EXTREMITY MMT:  MMT Right eval Left eval  Hip flexion 3+   Hip extension    Hip abduction    Hip adduction    Hip internal rotation    Hip external rotation    Knee flexion 3+   Knee extension 4-   Ankle dorsiflexion    Ankle plantarflexion    Ankle inversion    Ankle eversion     (Blank rows = not tested)   FUNCTIONAL TESTS:  5 times sit to stand: 14.55s  Timed up and go (TUG): 11.11s  GAIT: Distance walked: in clinic distances Assistive device utilized: None Level of assistance: Modified independence Comments: slightly antalgic, decreased step length and stance time on RLE                                                                                                                                TREATMENT DATE:  08/21/23 NuStep L 5 x4 min Bike L2 x 3 min STM to the HS and ITB with Tgun, with hands around the patella and distal quads Passive stretch right knee flexion and extension Leg press 20# 2x15, RLE TKE 20lb 2x5 Lateral 6in step ups x10 each  Bridge w/ march x10 SAQ 2lb with verbal and tactile cues for  VMO 2lb  SLR RLE x10  With ER x10 S2S from mat holding yellow wball 2x10 HS curls 20lb 2x10    08/01/23 Stairs step over step up and down two flights Bike level 4 x 6 minutes Passive stretch right knee flexion and extension Sit to stand from lower surfaces Calf stretches Leg press 20# both legs x 10, then right only no weight  Feet on ball K2C, bridges, isometric abs SAQ with verbal and tactile cues for VMO STM to the HS and ITB with Tgun, with hands around the patella and distal quads  07/27/23 Bike L 2 x 6 min Feels tight and full n the medial side and the back Multi angle Isometrics RLE LAQ HS curls green tband  Wall site 5x15'' Sit to stands 2x15 SLS ball toss then with airex 4in forward & lateral step ups RLE x 10  Heel raises 2x10  R knee PROM  Patellar mobs RLE SLR 2x10   07/25/23 Bike L 1 x6 min SAQ RLE x10, 2lb x10  RLE SLR x10, 1.5lb x 10  W/ ER x10 Heel slides 2x10 2# R hip abd in side lying 2x10   Adduction 2x10 R knee PROM  Patellar mobs STM to popliteal fold   07/20/23 Nustep L5 Patellar mobs PROM  R knee flex and ext QS x10 R knee Multi angle isometrics Sit to stand with yellow ball 2x10 Blue band HS curl 2x15 4in RLE step up x10 4in RLE step down 2x10 30# resisted lateral steps x3 each way  Backwards x5 Black bar heel raise 2x10 SL stance tapping cones x5  On airex x5  07/18/23 Bike partial revs x 5 minutes Leg press no wegiht 2 x10, then right only x15 TKE with ball behind knee standing  Resisted gait 30# all directions 4" step ups front and back  Feet on ball K2C, rotation, bridge, isometric abs Passive stretch right knee QS cues for VMO SAQ   07/14/23- EVAL, HEP    PATIENT EDUCATION:  Education details: POC, HEP, icing Person educated: Patient Education method: Explanation Education comprehension: verbalized understanding  HOME EXERCISE PROGRAM: Access Code: URL: [email or web address] Date:  07/14/2023 Prepared by: Donavon Fudge  Exercises - Supine Quad Set  - 1 x daily - 7 x weekly - 2 sets - 10 reps - Supine Heel Slide with Strap  - 1 x daily - 7 x weekly - 2 sets - 10 reps - Active Straight Leg Raise with Quad Set  - 1 x daily - 7 x weekly - 2 sets - 10 reps - Sit to Stand  - 1 x daily - 7 x weekly - 2 sets - 10 reps  ASSESSMENT:  CLINICAL IMPRESSION: Patient reports R knee tightness from scar toe scar with activities. She still has some noticeable swelling her R patellar tendon area. She has little pain with activity. Bilateral LE weakness present with lateral step ups. R quad weakness with SLR, more so with hip externally rotated.   Eval: Patient is a 63 y.o. female who was seen today for physical therapy evaluation and treatment for s/p right knee arthroscopy, partial medial and lateral menisectomies on 07/13/23. She is ambulating without an assistive device one day post op. She presents with ROM and strength deficits but is doing really well. Was advised to keep icing and to try not to overdo. Due to having a high copay, we will do 2x for first 2 weeks and then dial it down  1x, depending  on progress. Pt will benefit from PT to address her impairments to return to PLOF.    OBJECTIVE IMPAIRMENTS: Abnormal gait, decreased mobility, difficulty walking, decreased ROM, decreased strength, and pain.   ACTIVITY LIMITATIONS: squatting, stairs, transfers, and locomotion level  PARTICIPATION LIMITATIONS: driving, shopping, community activity, occupation, and yard work  CLINICAL DECISION MAKING: Stable/uncomplicated  EVALUATION COMPLEXITY: Low  GOALS: Goals reviewed with patient? Yes  SHORT TERM GOALS: Target date: 08/25/23  Patient will be independent with initial HEP. Baseline:  Goal status: progressing 07/18/23, Met 07/27/23   2. Patient will be able to ascend/descend stairs with reciprocal step pattern safely to access home and community.  Baseline: pain with stairs Goal  status: met 08/01/23   LONG TERM GOALS: Target date: 10/06/23  Patient will be independent with advanced/ongoing HEP to improve outcomes and carryover.  Baseline:  Goal status: INITIAL  2.  Patient will report at least 75% improvement in R knee pain to improve QOL. Baseline: 2/10 Goal status: INITIAL  3.  Patient will demonstrate improved R knee AROM to >/= 0-120 deg to allow for normal gait and stair mechanics. Baseline: 25-0-97 Goal status: INITIAL  4.  Patient will demonstrate improved functional LE strength as demonstrated by 5/5 in weak muscle groups. Baseline: 3+ hip and knee flexion, 4- ext Goal status: INITIAL  5.  Patient will be able to complete floor transfer and kneel without pain or difficulty to be able to play with her grandchildren  Baseline: unable to do  Goal status: INITIAL  6.  Patient will improve LEFS score to >60/80 to demonstrate minimal functional limitations Baseline: 21/80 Goal status: INITIAL    PLAN:  PT FREQUENCY: 1-2x/week  PT DURATION: 12 weeks  PLANNED INTERVENTIONS: 97110-Therapeutic exercises, 97530- Therapeutic activity, 97112- Neuromuscular re-education, 97535- Self Care, [id number]- Manual therapy, [phone]- Gait training, Patient/Family education, Balance training, Stair training, Taping, Dry Needling, Joint mobilization, Cryotherapy, and Moist heat  PLAN FOR NEXT SESSION: see how today went, TKE  *insurance does not cover vaso*  Protocol from surgeon is placed in the folder  Ollen Beverage, PTA 08/21/2023, 8:00 AM

## 2023-08-28 ENCOUNTER — Encounter: Payer: Self-pay | Admitting: Physical Therapy

## 2023-08-28 ENCOUNTER — Ambulatory Visit: Admitting: Physical Therapy

## 2023-08-28 DIAGNOSIS — Z87828 Personal history of other (healed) physical injury and trauma: Secondary | ICD-10-CM | POA: Diagnosis not present

## 2023-08-28 DIAGNOSIS — M25661 Stiffness of right knee, not elsewhere classified: Secondary | ICD-10-CM | POA: Diagnosis not present

## 2023-08-28 DIAGNOSIS — Z9889 Other specified postprocedural states: Secondary | ICD-10-CM | POA: Diagnosis not present

## 2023-08-28 DIAGNOSIS — M6281 Muscle weakness (generalized): Secondary | ICD-10-CM

## 2023-08-28 DIAGNOSIS — M25561 Pain in right knee: Secondary | ICD-10-CM | POA: Diagnosis not present

## 2023-08-28 NOTE — Therapy (Signed)
 OUTPATIENT PHYSICAL THERAPY LOWER EXTREMITY TREATMENT   Patient Name: Elizabeth [name] MRN: [id number] DOB:[birth year], 63 y.o., female Today's Date: 08/28/2023  END OF SESSION:  PT End of Session - 08/28/23 1346     Visit Number 8    Date for PT Re-Evaluation 10/06/23    PT Start Time 1345    PT Stop Time 1430    PT Time Calculation (min) 45 min    Activity Tolerance Patient tolerated treatment well    Behavior During Therapy [facility] for tasks assessed/performed             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   Vaginal candidiasis 07/2015   after antibiotics   Past Surgical History:  Procedure Laterality Date   APPENDECTOMY  03/2022   extended appendectomy   AUGMENTATION MAMMAPLASTY Bilateral 2004   COLONOSCOPY  2014   HYSTEROTOMY     Robotic   LAPAROTOMY  1982   LID LESION EXCISION Left 12/22/2017   Procedure: LID LESION EXCISION with reconstruction of lower   left and right eye lids;  Surgeon: Karan Osgood, MD;  Location: [facility] OR;  Service: Ophthalmology;  Laterality: Left;  Frozen sections   MENISCUS REPAIR Left 1997   NASAL SINUS SURGERY  06/2022   TONSILLECTOMY  1966   Patient Active Problem List   Diagnosis Date Noted   Chondromalacia, left knee 12/22/2021   Neuroma of foot 11/02/2021   Scar of back 08/24/2021   Skin lesion 08/10/2021   Degenerative tear of left medial meniscus 01/14/2020   Knee subluxation, left, initial encounter 12/23/2019   Lumbar radiculopathy 07/30/2019    Neck pain 08/15/2018   Nonallopathic lesion of cervical region 08/15/2018   Nonallopathic lesion of thoracic region 08/15/2018   Nonallopathic lesion of rib cage 08/15/2018   Nonallopathic lesion of lumbosacral region 08/15/2018   Nonallopathic lesion of sacral region 08/15/2018   Partial hamstring tear 09/23/2015   Diabetes mellitus (HCC) 09/17/2012    PCP: Selina Dale  REFERRING PROVIDER: Anniece Base  REFERRING DIAG:  [phone] (ICD-10-CM) - S/P right knee arthroscopy  Z98.890 (ICD-10-CM) - S/P arthroscopic partial medial meniscectomy  Z98.890 (ICD-10-CM) - S/P lateral meniscectomy of right knee    THERAPY DIAG:  S/P right knee arthroscopy  S/P lateral meniscectomy of right knee  Muscle weakness (generalized)  Stiffness of right knee, not elsewhere classified  Rationale for Evaluation and Treatment: Rehabilitation  ONSET DATE: 07/13/23  SUBJECTIVE:   SUBJECTIVE STATEMENT: "Same" Walked three miles yesterday causing some R knee swelling   PERTINENT HISTORY: s/p R Knee arthroscopy, partial medial and lateral meniscectomies, extensive chondroplasty DOS: 07/13/2023 Freq: 1-2 times per week for 6 weeks Start PT 1-3 days after surgery   PAIN:  Are you having pain? Yes: NPRS scale: 0/10 Pain location: R knee  Pain description: a little sharp medially, overall aching  Aggravating factors: stairs, quick movements  Relieving factors: tramadol, tylenol , ice  PRECAUTIONS: Knee  RED FLAGS: None   WEIGHT BEARING RESTRICTIONS: WBAT  FALLS:  Has patient fallen in last 6 months? No  LIVING ENVIRONMENT: Lives with: lives with their spouse Lives in: House/apartment Stairs: Yes: Internal: one set of stairs steps; on left going up and External: 3 steps; none Has following equipment at home: Crutches but not using them  OCCUPATION: Nurse  PLOF: Independent  PATIENT GOALS: to be able to kneel and get up off the floor, play with grandkids   NEXT MD VISIT:  07/28/23  OBJECTIVE:  Note: Objective measures were completed at Evaluation unless otherwise noted.  DIAGNOSTIC FINDINGS: N/A for knee  PATIENT SURVEYS:  LEFS 21/80  COGNITION: Overall cognitive status: Within functional limits for tasks assessed     SENSATION: WFL  EDEMA:  Swelling in R knee    POSTURE: No Significant postural limitations  PALPATION: TTP medial knee  LOWER EXTREMITY ROM:  Active ROM Right Eval seated Right 07/27/23 Right  AROM 08/01/23 Right AROM 08/21/23  Hip flexion      Hip extension      Hip abduction      Hip adduction      Hip internal rotation      Hip external rotation      Knee flexion 97 120 122 125  Knee extension -25 5 10 2   Ankle dorsiflexion      Ankle plantarflexion      Ankle inversion      Ankle eversion       (Blank rows = not tested)  LOWER EXTREMITY MMT:  MMT Right eval Right 08/28/23  Hip flexion 3+   Hip extension    Hip abduction    Hip adduction    Hip internal rotation    Hip external rotation    Knee flexion 3+ -4  Knee extension 4- 4  Ankle dorsiflexion    Ankle plantarflexion    Ankle inversion    Ankle eversion     (Blank rows = not tested)   FUNCTIONAL TESTS:  5 times sit to stand: 14.55s  Timed up and go (TUG): 11.11s  GAIT: Distance walked: in clinic distances Assistive device utilized: None Level of assistance: Modified independence Comments: slightly antalgic, decreased step length and stance time on RLE                                                                                                                                TREATMENT DATE:  08/28/23 NuStep L 5 x4 min, x2 min LE only  STM to the HS and ITB with Tgun, with hands around the patella and distal quads 6in forward & lateral step ups  30lb resisted gait 4 way x 3 each Heel raises black bar 2x12 Leg press 40lb 2x15 RLE SLR 2lb   W/ ER x10 S2S from mat holding yellow wball 2x10   08/21/23 NuStep L 5 x4 min  Bike L2 x 3  min STM to the HS and ITB with Tgun, with hands around the patella and distal quads Passive stretch right knee flexion and extension Leg press 20# 2x15, RLE TKE 20lb 2x5 Lateral 6in step ups x10 each  Bridge w/ march x10 SAQ 2lb with verbal and tactile cues for VMO 2lb SLR RLE x10  With ER x10 S2S from mat holding yellow wball 2x10 HS curls 20lb 2x10    08/01/23 Stairs step over step up and down two flights Bike level 4 x 6 minutes Passive stretch right knee flexion and extension Sit to stand from lower surfaces Calf stretches Leg press 20# both legs x 10, then right only no weight  Feet on ball K2C, bridges, isometric abs SAQ with verbal and tactile cues for VMO STM to the HS and ITB with Tgun, with hands around the patella and distal quads  07/27/23 Bike L 2 x 6 min Feels tight and full n the medial side and the back Multi angle Isometrics RLE LAQ HS curls green tband  Wall site 5x15'' Sit to stands 2x15 SLS ball toss then with airex 4in forward & lateral step ups RLE x 10  Heel raises 2x10  R knee PROM  Patellar mobs RLE SLR 2x10   07/25/23 Bike L 1 x6 min SAQ RLE x10, 2lb x10  RLE SLR x10, 1.5lb x 10  W/ ER x10 Heel slides 2x10 2# R hip abd in side lying 2x10   Adduction 2x10 R knee PROM  Patellar mobs STM to popliteal fold   07/20/23 Nustep L5 Patellar mobs PROM  R knee flex and ext QS x10 R knee Multi angle isometrics Sit to stand with yellow ball 2x10 Blue band HS curl 2x15 4in RLE step up x10 4in RLE step down 2x10 30# resisted lateral steps x3 each way  Backwards x5 Black bar heel raise 2x10 SL stance tapping cones x5  On airex x5  07/18/23 Bike partial revs x 5 minutes Leg press no wegiht 2 x10, then right only x15 TKE with ball behind knee standing  Resisted gait 30# all directions 4" step ups front and back  Feet on ball K2C, rotation, bridge, isometric abs Passive stretch right knee QS cues for VMO SAQ   07/14/23- EVAL, HEP     PATIENT EDUCATION:  Education details: POC, HEP, icing Person educated: Patient Education method: Explanation Education comprehension: verbalized understanding  HOME EXERCISE PROGRAM: Access Code: URL: [email or web address] Date: 07/14/2023 Prepared by: Donavon Fudge  Exercises - Supine Quad Set  - 1 x daily - 7 x weekly - 2 sets - 10 reps - Supine Heel Slide with Strap  - 1 x daily - 7 x weekly - 2 sets - 10 reps - Active Straight Leg Raise with Quad Set  - 1 x daily - 7 x weekly - 2 sets - 10 reps - Sit to Stand  - 1 x daily - 7 x weekly - 2 sets - 10 reps  ASSESSMENT:  CLINICAL IMPRESSION: Patient reports R knee tightness from scar too scar with flexion. She still has some noticeable swelling her R patellar tendon area. She has little pain with activity. R HS and quad weakness with MMT. This weakness was evident with step up interventions.  Cues for proper squat form with sit to stands. Good control with resisted gait.  Eval: Patient is a 63 y.o. female who was seen today for physical therapy evaluation and treatment for s/p right  knee arthroscopy, partial medial and lateral menisectomies on 07/13/23. She is ambulating without an assistive device one day post op. She presents with ROM and strength deficits but is doing really well. Was advised to keep icing and to try not to overdo. Due to having a high copay, we will do 2x for first 2 weeks and then dial it down  1x, depending on progress. Pt will benefit from PT to address her impairments to return to PLOF.    OBJECTIVE IMPAIRMENTS: Abnormal gait, decreased mobility, difficulty walking, decreased ROM, decreased strength, and pain.   ACTIVITY LIMITATIONS: squatting, stairs, transfers, and locomotion level  PARTICIPATION LIMITATIONS: driving, shopping, community activity, occupation, and yard work  CLINICAL DECISION MAKING: Stable/uncomplicated  EVALUATION COMPLEXITY: Low  GOALS: Goals reviewed with  patient? Yes  SHORT TERM GOALS: Target date: 08/25/23  Patient will be independent with initial HEP. Baseline:  Goal status: progressing 07/18/23, Met 07/27/23   2. Patient will be able to ascend/descend stairs with reciprocal step pattern safely to access home and community.  Baseline: pain with stairs Goal status: met 08/01/23   LONG TERM GOALS: Target date: 10/06/23  Patient will be independent with advanced/ongoing HEP to improve outcomes and carryover.  Baseline:  Goal status: INITIAL  2.  Patient will report at least 75% improvement in R knee pain to improve QOL. Baseline: 2/10 Goal status: INITIAL  3.  Patient will demonstrate improved R knee AROM to >/= 0-120 deg to allow for normal gait and stair mechanics. Baseline: 25-0-97 Goal status: INITIAL  4.  Patient will demonstrate improved functional LE strength as demonstrated by 5/5 in weak muscle groups. Baseline: 3+ hip and knee flexion, 4- ext Goal status: INITIAL  5.  Patient will be able to complete floor transfer and kneel without pain or difficulty to be able to play with her grandchildren  Baseline: unable to do  Goal status: INITIAL  6.  Patient will improve LEFS score to >60/80 to demonstrate minimal functional limitations Baseline: 21/80 Goal status: INITIAL    PLAN:  PT FREQUENCY: 1-2x/week  PT DURATION: 12 weeks  PLANNED INTERVENTIONS: 97110-Therapeutic exercises, 97530- Therapeutic activity, 97112- Neuromuscular re-education, 97535- Self Care, 16109- Manual therapy, [phone]- Gait training, Patient/Family education, Balance training, Stair training, Taping, Dry Needling, Joint mobilization, Cryotherapy, and Moist heat  PLAN FOR NEXT SESSION: see how today went, TKE  *insurance does not cover vaso*  Protocol from surgeon is placed in the folder  Ollen Beverage, PTA 08/28/2023, 1:46 PM

## 2023-08-31 ENCOUNTER — Encounter: Payer: Self-pay | Admitting: Physical Therapy

## 2023-08-31 ENCOUNTER — Ambulatory Visit: Admitting: Physical Therapy

## 2023-08-31 NOTE — Therapy (Signed)
 OUTPATIENT PHYSICAL THERAPY LOWER EXTREMITY TREATMENT   Patient Name: Elizabeth [name] MRN: [id number] DOB:05-Mar-1961, 63 y.o., female Today's Date: 08/31/2023  END OF SESSION:  PT End of Session - 08/31/23 1256     Visit Number 9    Date for PT Re-Evaluation 10/06/23    PT Start Time 1300    PT Stop Time 1345    PT Time Calculation (min) 45 min    Activity Tolerance Patient tolerated treatment well    Behavior During Therapy [facility] for tasks assessed/performed          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   Vaginal candidiasis 07/2015   after antibiotics   Past Surgical History:  Procedure Laterality Date   APPENDECTOMY  03/2022   extended appendectomy   AUGMENTATION MAMMAPLASTY Bilateral 2004   COLONOSCOPY  2014   HYSTEROTOMY     Robotic   LAPAROTOMY  1982   LID LESION EXCISION Left 12/22/2017   Procedure: LID LESION EXCISION with reconstruction of lower   left and right eye lids;  Surgeon: Karan Osgood, MD;  Location: [facility] OR;  Service: Ophthalmology;  Laterality: Left;  Frozen sections   MENISCUS REPAIR Left 1997   NASAL SINUS SURGERY  06/2022   TONSILLECTOMY  1966   Patient Active Problem List   Diagnosis Date Noted   Chondromalacia, left knee 12/22/2021   Neuroma of foot 11/02/2021   Scar of back 08/24/2021   Skin lesion 08/10/2021   Degenerative tear of left medial meniscus 01/14/2020   Knee subluxation, left, initial encounter 12/23/2019   Lumbar radiculopathy 07/30/2019    Neck pain 08/15/2018   Nonallopathic lesion of cervical region 08/15/2018   Nonallopathic lesion of thoracic region 08/15/2018   Nonallopathic lesion of rib cage 08/15/2018   Nonallopathic lesion of lumbosacral region 08/15/2018   Nonallopathic lesion of sacral region 08/15/2018   Partial hamstring tear 09/23/2015   Diabetes mellitus (HCC) 09/17/2012    PCP: Selina Dale  REFERRING PROVIDER: Anniece Base  REFERRING DIAG:  [phone] (ICD-10-CM) - S/P right knee arthroscopy  Z98.890 (ICD-10-CM) - S/P arthroscopic partial medial meniscectomy  Z98.890 (ICD-10-CM) - S/P lateral meniscectomy of right knee    THERAPY DIAG:  S/P right knee arthroscopy  S/P lateral meniscectomy of right knee  Muscle weakness (generalized)  Stiffness of right knee, not elsewhere classified  Acute pain of right knee  S/P arthroscopic partial medial meniscectomy of right knee  Rationale for Evaluation and Treatment: Rehabilitation  ONSET DATE: 07/13/23  SUBJECTIVE:   SUBJECTIVE STATEMENT: Pt enters doing well, pt stated that she seen the MD yesterday and was informed that she did not need PT. She has a lot ow swelling and was placed on meloxicam  for 10 days. Has a follow up MD appointment in 4 weeks if the swelling is still present.  PERTINENT HISTORY: s/p R Knee arthroscopy, partial medial and  lateral meniscectomies, extensive chondroplasty DOS: 07/13/2023 Freq: 1-2 times per week for 6 weeks Start PT 1-3 days after surgery   PAIN:  Are you having pain? Yes: NPRS scale: 0/10 Pain location: R knee  Pain description: a little sharp medially, overall aching  Aggravating factors: stairs, quick movements  Relieving factors: tramadol, tylenol , ice  PRECAUTIONS: Knee  RED FLAGS: None   WEIGHT BEARING RESTRICTIONS: WBAT  FALLS:  Has patient fallen in last 6 months? No  LIVING ENVIRONMENT: Lives with: lives with their spouse Lives in: House/apartment Stairs: Yes: Internal: one set of  stairs steps; on left going up and External: 3 steps; none Has following equipment at home: Crutches but not using them  OCCUPATION: Nurse  PLOF: Independent  PATIENT GOALS: to be able to kneel and get up off the floor, play with grandkids   NEXT MD VISIT: 07/28/23  OBJECTIVE:  Note: Objective measures were completed at Evaluation unless otherwise noted.  DIAGNOSTIC FINDINGS: N/A for knee  PATIENT SURVEYS:  LEFS 21/80  COGNITION: Overall cognitive status: Within functional limits for tasks assessed     SENSATION: WFL  EDEMA:  Swelling in R knee    POSTURE: No Significant postural limitations  PALPATION: TTP medial knee  LOWER EXTREMITY ROM:  Active ROM Right Eval seated Right 07/27/23 Right  AROM 08/01/23 Right AROM 08/21/23  Hip flexion      Hip extension      Hip abduction      Hip adduction      Hip internal rotation      Hip external rotation      Knee flexion 97 120 122 125  Knee extension -25 5 10 2   Ankle dorsiflexion      Ankle plantarflexion      Ankle inversion      Ankle eversion       (Blank rows = not tested)  LOWER EXTREMITY MMT:  MMT Right eval Right 08/28/23  Hip flexion 3+   Hip extension    Hip abduction    Hip adduction    Hip internal rotation    Hip external rotation    Knee flexion 3+ -4  Knee extension 4- 4  Ankle dorsiflexion    Ankle plantarflexion    Ankle inversion    Ankle eversion     (Blank rows = not tested)   FUNCTIONAL TESTS:  5 times sit to stand: 14.55s  Timed up and go (TUG): 11.11s  GAIT: Distance walked: in clinic distances Assistive device utilized: None Level of assistance: Modified independence Comments: slightly antalgic, decreased step length and stance time on RLE                                                                                                                                TREATMENT DATE:  08/28/23 NuStep L 5 x4 min, x2 min LE only  STM to the HS and ITB with Tgun, with hands  around the patella and distal quads 6in forward & lateral step ups  30lb resisted gait 4 way x 3 each Heel raises black bar 2x12 Leg press 40lb 2x15 RLE SLR 2lb   W/ ER x10 S2S from mat holding yellow wball 2x10   08/21/23 NuStep L 5 x4 min Bike L2 x 3 min STM to the HS and ITB with Tgun, with hands around the patella and distal quads Passive stretch right knee flexion and extension Leg press 20# 2x15, RLE TKE 20lb 2x5 Lateral 6in step ups x10 each  Bridge w/ march x10 SAQ 2lb with verbal and tactile cues for VMO 2lb SLR RLE x10  With ER x10 S2S from mat holding yellow wball 2x10 HS curls 20lb 2x10    08/01/23 Stairs step over step up and down two flights Bike level 4 x 6 minutes Passive stretch right knee flexion and extension Sit to stand from lower surfaces Calf stretches Leg press 20# both legs x 10, then right only no weight  Feet on ball K2C, bridges, isometric abs SAQ with verbal and tactile cues for VMO STM to the HS and ITB with Tgun, with hands around the patella and distal quads  07/27/23 Bike L 2 x 6 min Feels tight and full n the medial side and the back Multi angle Isometrics RLE LAQ HS curls green tband  Wall site 5x15'' Sit to stands 2x15 SLS ball toss then with airex 4in forward & lateral step ups RLE x 10  Heel raises 2x10  R knee PROM  Patellar mobs RLE SLR 2x10   07/25/23 Bike L 1 x6 min SAQ RLE x10, 2lb x10  RLE SLR x10, 1.5lb x 10  W/ ER x10 Heel slides 2x10 2# R hip abd in side lying 2x10   Adduction 2x10 R knee PROM  Patellar mobs STM to popliteal fold   07/20/23 Nustep L5 Patellar mobs PROM  R knee flex and ext QS x10 R knee Multi angle isometrics Sit to stand with yellow ball 2x10 Blue band HS curl 2x15 4in RLE step up x10 4in RLE step down 2x10 30# resisted lateral steps x3 each way  Backwards x5 Black bar heel raise 2x10 SL stance tapping cones x5  On airex x5  07/18/23 Bike partial revs x 5 minutes Leg press no  wegiht 2 x10, then right only x15 TKE with ball behind knee standing  Resisted gait 30# all directions 4 step ups front and back  Feet on ball K2C, rotation, bridge, isometric abs Passive stretch right knee QS cues for VMO SAQ   07/14/23- EVAL, HEP    PATIENT EDUCATION:  Education details: POC, HEP, icing Person educated: Patient Education method: Explanation Education comprehension: verbalized understanding  HOME EXERCISE PROGRAM: Access Code: URL: [email or web address] Date: 07/14/2023 Prepared by: Donavon Fudge  Exercises - Supine Quad Set  - 1 x daily - 7 x weekly - 2 sets - 10 reps - Supine Heel Slide with Strap  - 1 x daily - 7 x weekly - 2 sets - 10 reps - Active Straight Leg Raise with Quad Set  - 1 x daily - 7 x weekly - 2 sets - 10 reps - Sit to Stand  - 1 x daily - 7 x weekly - 2 sets - 10 reps  ASSESSMENT:  CLINICAL IMPRESSION: D/C PT, no charge for today's visit. Informed by MD that's she did not need therapy. Confident that she can continue her care on her own.  Eval: Patient is a 63 y.o. female who was seen today for physical therapy evaluation and treatment for s/p right knee arthroscopy, partial medial and lateral menisectomies on 07/13/23. She is ambulating without an assistive device one day post op. She presents with ROM and strength deficits but is doing really well. Was advised to keep icing and to try not to overdo. Due to having a high copay, we will do 2x for first 2 weeks and then dial it down  1x, depending on progress. Pt will benefit from PT to address her impairments to return to PLOF.    OBJECTIVE IMPAIRMENTS: Abnormal gait, decreased mobility, difficulty walking, decreased ROM, decreased strength, and pain.   ACTIVITY LIMITATIONS: squatting, stairs, transfers, and locomotion level  PARTICIPATION LIMITATIONS: driving, shopping, community activity, occupation, and yard work  CLINICAL DECISION MAKING:  Stable/uncomplicated  EVALUATION COMPLEXITY: Low  GOALS: Goals reviewed with patient? Yes  SHORT TERM GOALS: Target date: 08/25/23  Patient will be independent with initial HEP. Baseline:  Goal status: progressing 07/18/23, Met 07/27/23   2. Patient will be able to ascend/descend stairs with reciprocal step pattern safely to access home and community.  Baseline: pain with stairs Goal status: met 08/01/23   LONG TERM GOALS: Target date: 10/06/23  Patient will be independent with advanced/ongoing HEP to improve outcomes and carryover.  Baseline:  Goal status: Avid gym goer and swimmer  2.  Patient will report at least 75% improvement in R knee pain to improve QOL. Baseline: 2/10 Goal status: INITIAL  3.  Patient will demonstrate improved R knee AROM to >/= 0-120 deg to allow for normal gait and stair mechanics. Baseline: 25-0-97 Goal status: INITIAL  4.  Patient will demonstrate improved functional LE strength as demonstrated by 5/5 in weak muscle groups. Baseline: 3+ hip and knee flexion, 4- ext Goal status: Progressing 08/31/23  5.  Patient will be able to complete floor transfer and kneel without pain or difficulty to be able to play with her grandchildren  Baseline: unable to do  Goal status: Met 08/31/23  6.  Patient will improve LEFS score to >60/80 to demonstrate minimal functional limitations Baseline: 21/80 Goal status: INITIAL    PLAN:  PT FREQUENCY: 1-2x/week  PT DURATION: 12 weeks  PLANNED INTERVENTIONS: 97110-Therapeutic exercises, 97530- Therapeutic activity, 97112- Neuromuscular re-education, 97535- Self Care, 13086- Manual therapy, [phone]- Gait training, Patient/Family education, Balance training, Stair training, Taping, Dry Needling, Joint mobilization, Cryotherapy, and Moist heat  PLAN FOR NEXT SESSION: D/C PT  PHYSICAL THERAPY DISCHARGE SUMMARY  Visits from Start of Care: 9 Patient agrees to discharge. Patient goals were partially met. Patient is  being discharged due to being pleased with the current functional level.  Donavon Fudge, PT, DPT Ollen Beverage, PTA 08/31/2023, 12:58 PM

## 2023-09-04 ENCOUNTER — Ambulatory Visit: Admitting: Physical Therapy

## 2023-09-11 ENCOUNTER — Ambulatory Visit: Admitting: Physical Therapy

## 2023-09-18 ENCOUNTER — Ambulatory Visit: Admitting: Physical Therapy

## 2023-10-24 DIAGNOSIS — E063 Autoimmune thyroiditis: Secondary | ICD-10-CM | POA: Diagnosis not present

## 2023-10-24 DIAGNOSIS — E109 Type 1 diabetes mellitus without complications: Secondary | ICD-10-CM | POA: Diagnosis not present

## 2024-01-11 ENCOUNTER — Other Ambulatory Visit (HOSPITAL_BASED_OUTPATIENT_CLINIC_OR_DEPARTMENT_OTHER): Payer: Self-pay

## 2024-01-11 MED ORDER — FLUZONE 0.5 ML IM SUSY
0.5000 mL | PREFILLED_SYRINGE | Freq: Once | INTRAMUSCULAR | 0 refills | Status: AC
  Filled 2024-01-11: qty 0.5, 1d supply, fill #0

## 2024-01-16 DIAGNOSIS — H01002 Unspecified blepharitis right lower eyelid: Secondary | ICD-10-CM | POA: Diagnosis not present

## 2024-01-16 DIAGNOSIS — H01005 Unspecified blepharitis left lower eyelid: Secondary | ICD-10-CM | POA: Diagnosis not present

## 2024-01-19 DIAGNOSIS — R0981 Nasal congestion: Secondary | ICD-10-CM | POA: Diagnosis not present

## 2024-01-19 DIAGNOSIS — J3489 Other specified disorders of nose and nasal sinuses: Secondary | ICD-10-CM | POA: Diagnosis not present

## 2024-01-19 DIAGNOSIS — R519 Headache, unspecified: Secondary | ICD-10-CM | POA: Diagnosis not present

## 2024-01-19 DIAGNOSIS — Z9889 Other specified postprocedural states: Secondary | ICD-10-CM | POA: Diagnosis not present

## 2024-01-19 DIAGNOSIS — H9209 Otalgia, unspecified ear: Secondary | ICD-10-CM | POA: Diagnosis not present

## 2024-02-07 DIAGNOSIS — K6282 Dysplasia of anus: Secondary | ICD-10-CM | POA: Diagnosis not present

## 2024-02-07 DIAGNOSIS — K644 Residual hemorrhoidal skin tags: Secondary | ICD-10-CM | POA: Diagnosis not present

## 2024-02-09 DIAGNOSIS — H938X1 Other specified disorders of right ear: Secondary | ICD-10-CM | POA: Diagnosis not present

## 2024-02-14 DIAGNOSIS — D510 Vitamin B12 deficiency anemia due to intrinsic factor deficiency: Secondary | ICD-10-CM | POA: Diagnosis not present

## 2024-02-14 DIAGNOSIS — E109 Type 1 diabetes mellitus without complications: Secondary | ICD-10-CM | POA: Diagnosis not present

## 2024-02-14 DIAGNOSIS — E039 Hypothyroidism, unspecified: Secondary | ICD-10-CM | POA: Diagnosis not present

## 2024-02-14 DIAGNOSIS — I10 Essential (primary) hypertension: Secondary | ICD-10-CM | POA: Diagnosis not present

## 2024-02-14 DIAGNOSIS — E559 Vitamin D deficiency, unspecified: Secondary | ICD-10-CM | POA: Diagnosis not present

## 2024-02-14 DIAGNOSIS — E139 Other specified diabetes mellitus without complications: Secondary | ICD-10-CM | POA: Diagnosis not present

## 2024-02-21 DIAGNOSIS — B078 Other viral warts: Secondary | ICD-10-CM | POA: Diagnosis not present

## 2024-02-21 DIAGNOSIS — D225 Melanocytic nevi of trunk: Secondary | ICD-10-CM | POA: Diagnosis not present

## 2024-02-21 DIAGNOSIS — Z1283 Encounter for screening for malignant neoplasm of skin: Secondary | ICD-10-CM | POA: Diagnosis not present

## 2024-02-21 DIAGNOSIS — L578 Other skin changes due to chronic exposure to nonionizing radiation: Secondary | ICD-10-CM | POA: Diagnosis not present

## 2024-02-21 DIAGNOSIS — L820 Inflamed seborrheic keratosis: Secondary | ICD-10-CM | POA: Diagnosis not present

## 2024-02-28 DIAGNOSIS — M1711 Unilateral primary osteoarthritis, right knee: Secondary | ICD-10-CM | POA: Diagnosis not present

## 2024-02-28 DIAGNOSIS — M25561 Pain in right knee: Secondary | ICD-10-CM | POA: Diagnosis not present
